# Patient Record
Sex: Male | Born: 1995 | Race: Black or African American | Hispanic: No | Marital: Single
Health system: Southern US, Community
[De-identification: ages and names within clinical notes are randomized; demographics above are authoritative.]

## PROBLEM LIST (undated history)

## (undated) HISTORY — PX: COSMETIC SURGERY: SHX468

## (undated) HISTORY — PX: CARDIAC SURGERY: SHX584

## (undated) HISTORY — PX: FRACTURE SURGERY: SHX138

---

## 2016-11-30 ENCOUNTER — Encounter (HOSPITAL_COMMUNITY): Payer: Self-pay | Admitting: *Deleted

## 2016-11-30 ENCOUNTER — Ambulatory Visit (HOSPITAL_COMMUNITY)
Admission: EM | Admit: 2016-11-30 | Discharge: 2016-11-30 | Disposition: A | Discharge status: Home / Self Care | Payer: Self-pay | Attending: Internal Medicine | Admitting: Internal Medicine

## 2016-11-30 DIAGNOSIS — A084 Viral intestinal infection, unspecified: Secondary | ICD-10-CM

## 2016-11-30 DIAGNOSIS — R197 Diarrhea, unspecified: Secondary | ICD-10-CM

## 2016-11-30 DIAGNOSIS — R112 Nausea with vomiting, unspecified: Secondary | ICD-10-CM

## 2016-11-30 MED ORDER — ONDANSETRON HCL 4 MG PO TABS: 4.0000 mg | ORAL_TABLET | Freq: Four times a day (QID) | ORAL | 0 refills | Status: DC

## 2016-11-30 MED ORDER — ONDANSETRON 4 MG PO TBDP
4.0000 mg | ORAL_TABLET | Freq: Once | ORAL | Status: AC
  Administered 2016-11-30: 4 mg via ORAL

## 2016-11-30 MED ORDER — ONDANSETRON 4 MG PO TBDP
ORAL_TABLET | ORAL | Status: AC
  Filled 2016-11-30: qty 1

## 2016-11-30 NOTE — Discharge Instructions (Signed)
Take Zofran for nausea and vomiting. No fatty foods, greasy foods, or fast foods or dairy for the next couple days. Stay well-hydrated drink water and Pedialyte.

## 2016-11-30 NOTE — ED Provider Notes (Signed)
CSN: [id number]     Arrival date & time 11/30/16  1550 History   First MD Initiated Contact with Patient 11/30/16 1701     Chief Complaint  Patient presents with  . Diarrhea   (Consider location/radiation/quality/duration/timing/severity/associated sxs/prior Treatment) 21 year old male states that early this morning he developed some vomiting and diarrhea, upset stomach and occasional dizziness. He has vomited approximately 4 times today and diarrhea all day. No blood is seen. Denies known fever. Has generalized abdominal discomfort.      History reviewed. No pertinent past medical history. History reviewed. No pertinent surgical history. No family history on file. Social History  Substance Use Topics  . Smoking status: Current Every Day Smoker  . Smokeless tobacco: Never Used  . Alcohol use Yes    Review of Systems  Constitutional: Positive for activity change. Negative for fever.  HENT: Negative.   Respiratory: Negative.   Cardiovascular: Negative.   Gastrointestinal: Positive for abdominal pain, diarrhea, nausea and vomiting. Negative for blood in stool.  Genitourinary: Negative.   Musculoskeletal: Negative.   Neurological: Positive for dizziness.  All other systems reviewed and are negative.   Allergies  Patient has no known allergies.  Home Medications   Prior to Admission medications   Medication Sig Start Date End Date Taking? Authorizing Provider  ondansetron (ZOFRAN) 4 MG tablet Take 1 tablet (4 mg total) by mouth every 6 (six) hours. 11/30/16   Hayden RasmussenMabe, Bernal Luhman, NP   Meds Ordered and Administered this Visit   Medications  ondansetron (ZOFRAN-ODT) disintegrating tablet 4 mg (not administered)    BP (!) 160/72 (BP Location: Right Arm)   Pulse 80   Temp 98.6 F (37 C) (Oral)   Resp 18  No data found.   Physical Exam  Constitutional: He is oriented to person, place, and time. He appears well-developed and well-nourished. No distress.  Eyes: EOM are normal.   Neck: Normal range of motion. Neck supple.  Cardiovascular: Normal rate, regular rhythm and normal heart sounds.   Pulmonary/Chest: Effort normal and breath sounds normal. No respiratory distress.  Abdominal: Bowel sounds are normal. He exhibits no distension and no mass.  Mild tenderness to the right hemiabdomen. No rebound or gaurding.  Neurological: He is alert and oriented to person, place, and time.  Skin: Skin is warm and dry.  Nursing note and vitals reviewed.   Urgent Care Course     Procedures (including critical care time)  Labs Review Labs Reviewed - No data to display  Imaging Review No results found.   Visual Acuity Review  Right Eye Distance:   Left Eye Distance:   Bilateral Distance:    Right Eye Near:   Left Eye Near:    Bilateral Near:         MDM   1. Viral gastroenteritis   2. Nausea vomiting and diarrhea    Take Zofran for nausea and vomiting. No fatty foods, greasy foods, or fast foods or dairy for the next couple days. Stay well-hydrated drink water and Pedialyte. Meds ordered this encounter  Medications  . ondansetron (ZOFRAN) 4 MG tablet    Sig: Take 1 tablet (4 mg total) by mouth every 6 (six) hours.    Dispense:  12 tablet    Refill:  0    Order Specific Question:   Supervising Provider    Answer:   Eustace MooreMURRAY, LAURA W [[id number]]  . ondansetron (ZOFRAN-ODT) disintegrating tablet 4 mg       Hayden RasmussenMabe, Danisa Kopec, NP 11/30/16 1726

## 2016-11-30 NOTE — ED Triage Notes (Signed)
Pt  Reports   Nausea  Vomiting   Diarrhea        And   Stomach      Pain    This  Am

## 2018-08-16 ENCOUNTER — Ambulatory Visit (INDEPENDENT_AMBULATORY_CARE_PROVIDER_SITE_OTHER): Payer: 59

## 2018-08-16 ENCOUNTER — Encounter: Payer: Self-pay | Admitting: Emergency Medicine

## 2018-08-16 ENCOUNTER — Ambulatory Visit
Admission: EM | Admit: 2018-08-16 | Discharge: 2018-08-16 | Disposition: A | Discharge status: Home / Self Care | Payer: 59 | Attending: Family Medicine | Admitting: Family Medicine

## 2018-08-16 DIAGNOSIS — S62619A Displaced fracture of proximal phalanx of unspecified finger, initial encounter for closed fracture: Secondary | ICD-10-CM | POA: Diagnosis not present

## 2018-08-16 DIAGNOSIS — W228XXA Striking against or struck by other objects, initial encounter: Secondary | ICD-10-CM | POA: Diagnosis not present

## 2018-08-16 MED ORDER — NAPROXEN 500 MG PO TABS: 500.0000 mg | ORAL_TABLET | Freq: Two times a day (BID) | ORAL | 0 refills | Status: DC

## 2018-08-16 NOTE — ED Triage Notes (Signed)
Pt presents to [facility] for assessment of left hand pain x 4 days after punching multiple surfaces and then states he threw a piece of clothing a few days ago and its been hurting with movement and swollen since.

## 2018-08-16 NOTE — ED Notes (Signed)
Patient able to ambulate independently  

## 2018-08-16 NOTE — Discharge Instructions (Signed)
X-ray showed small fracture of middle finger Splint placed Continue conservative management of rest, ice, and elevation Take naproxen as needed for pain relief (may cause abdominal discomfort, ulcers, and GI bleeds avoid taking with other NSAIDs) Follow up with hand specialist for further evaluation and management Return or go to the ER if you have any new or worsening symptoms (fever, chills, chest pain, worsening hand pain, increased redness or swelling, abdominal pain, changes in bowel or bladder habits, etc...)

## 2018-08-16 NOTE — ED Provider Notes (Signed)
[facility]   [id number] 08/16/18 Arrival Time: 1319  CC: Left middle finger pain  SUBJECTIVE: History from: patient. Bryce Pace is a RHD 23 y.o. male complains of left middle finger pain x 4 days.  Symptoms began after punching a wooden object.  Localizes the pain to the left middle finger.  Describes the pain as constant and throbbing in character.  Has tried OTC medications without relief.  Symptoms are made worse with making a fist.  Denies similar symptoms in the past. Complains of associated swelling.  Denies fever, chills, erythema, ecchymosis, weakness, numbness and tingling.      ROS: As per HPI.  History reviewed. No pertinent past medical history. History reviewed. No pertinent surgical history. No Known Allergies No current facility-administered medications on file prior to encounter.    No current outpatient medications on file prior to encounter.   Social History   Socioeconomic History  . Marital status: Single    Spouse name: Not on file  . Number of children: Not on file  . Years of education: Not on file  . Highest education level: Not on file  Occupational History  . Not on file  Social Needs  . Financial resource strain: Not on file  . Food insecurity:    Worry: Not on file    Inability: Not on file  . Transportation needs:    Medical: Not on file    Non-medical: Not on file  Tobacco Use  . Smoking status: Current Every Day Smoker  . Smokeless tobacco: Never Used  Substance and Sexual Activity  . Alcohol use: Yes  . Drug use: Not on file  . Sexual activity: Not on file  Lifestyle  . Physical activity:    Days per week: Not on file    Minutes per session: Not on file  . Stress: Not on file  Relationships  . Social connections:    Talks on phone: Not on file    Gets together: Not on file    Attends religious service: Not on file    Active member of club or organization: Not on file    Attends meetings of clubs or organizations:  Not on file    Relationship status: Not on file  . Intimate partner violence:    Fear of current or ex partner: Not on file    Emotionally abused: Not on file    Physically abused: Not on file    Forced sexual activity: Not on file  Other Topics Concern  . Not on file  Social History Narrative  . Not on file   History reviewed. No pertinent family history.  OBJECTIVE:  Vitals:   08/16/18 1328  BP: (!) 118/53  Pulse: (!) 52  Resp: 16  Temp: 98.2 F (36.8 C)  TempSrc: Oral  SpO2: 98%    General appearance: Alert; in no acute distress.  Head: NCAT Lungs: Normal respiratory effort CV: Radial pulse 2+; cap refill < 2 seconds Musculoskeletal: Left hand Inspection: Skin warm, dry, clear and intact.  Mild swelling over third proximal phalanx Palpation: TTP over 3rd proximal phalanx, and PIP joint ROM: LROM Strength: 3/5 grip strength Skin: warm and dry Neurologic: Ambulates without difficulty; Sensation intact about the upper extremities Psychological: alert and cooperative; normal mood and affect  DIAGNOSTIC STUDIES:  Dg Finger Middle Left  Result Date: 08/16/2018 CLINICAL DATA:  24 y/o M; hand injury after punching. Pain at the third MCP and PIP joints. EXAM: LEFT MIDDLE FINGER 2+V COMPARISON:  None. FINDINGS: Tiny acute avulsion fracture from the volar base of the third middle phalanx. No additional fracture or dislocation identified. IMPRESSION: Tiny acute avulsion fracture from the volar base of the third middle phalanx. No additional fracture or dislocation identified. Electronically Signed   By: Mitzi Hansen M.D.   On: 08/16/2018 13:53     ASSESSMENT & PLAN:  1. Closed avulsion fracture of proximal phalanx of finger, initial encounter    Meds ordered this encounter  Medications  . naproxen (NAPROSYN) 500 MG tablet    Sig: Take 1 tablet (500 mg total) by mouth 2 (two) times daily.    Dispense:  30 tablet    Refill:  0    Order Specific Question:    Supervising Provider    Answer:   Eustace Moore [[id number]]   X-ray showed small fracture of middle finger Splint placed Continue conservative management of rest, ice, and elevation Take naproxen as needed for pain relief (may cause abdominal discomfort, ulcers, and GI bleeds avoid taking with other NSAIDs) Follow up with hand specialist for further evaluation and management Return or go to the ER if you have any new or worsening symptoms (fever, chills, chest pain, worsening hand pain, increased redness or swelling, abdominal pain, changes in bowel or bladder habits, etc...)   Reviewed expectations re: course of current medical issues. Questions answered. Outlined signs and symptoms indicating need for more acute intervention. Patient verbalized understanding. After Visit Summary given.    [name], PA-C 08/16/18 1504

## 2019-02-14 ENCOUNTER — Other Ambulatory Visit: Payer: Self-pay

## 2019-02-14 ENCOUNTER — Emergency Department (HOSPITAL_COMMUNITY)
Admission: EM | Admit: 2019-02-14 | Discharge: 2019-02-15 | Disposition: A | Discharge status: Home / Self Care | Payer: 59 | Attending: Emergency Medicine | Admitting: Emergency Medicine

## 2019-02-14 DIAGNOSIS — Z79899 Other long term (current) drug therapy: Secondary | ICD-10-CM | POA: Diagnosis not present

## 2019-02-14 DIAGNOSIS — F17200 Nicotine dependence, unspecified, uncomplicated: Secondary | ICD-10-CM | POA: Insufficient documentation

## 2019-02-14 DIAGNOSIS — Y906 Blood alcohol level of 120-199 mg/100 ml: Secondary | ICD-10-CM | POA: Insufficient documentation

## 2019-02-14 DIAGNOSIS — M791 Myalgia, unspecified site: Secondary | ICD-10-CM | POA: Insufficient documentation

## 2019-02-14 DIAGNOSIS — R4182 Altered mental status, unspecified: Secondary | ICD-10-CM | POA: Diagnosis present

## 2019-02-14 DIAGNOSIS — F19920 Other psychoactive substance use, unspecified with intoxication, uncomplicated: Secondary | ICD-10-CM | POA: Diagnosis not present

## 2019-02-14 MED ORDER — LORAZEPAM 2 MG/ML IJ SOLN
1.0000 mg | Freq: Once | INTRAMUSCULAR | Status: AC
  Administered 2019-02-15: 1 mg via INTRAVENOUS
  Filled 2019-02-14: qty 1

## 2019-02-14 MED ORDER — FAMOTIDINE IN NACL 20-0.9 MG/50ML-% IV SOLN
20.0000 mg | Freq: Once | INTRAVENOUS | Status: AC
  Administered 2019-02-15: 20 mg via INTRAVENOUS
  Filled 2019-02-14: qty 50

## 2019-02-14 MED ORDER — SODIUM CHLORIDE 0.9 % IV BOLUS
2000.0000 mL | Freq: Once | INTRAVENOUS | Status: AC
  Administered 2019-02-15: 2000 mL via INTRAVENOUS

## 2019-02-14 NOTE — ED Triage Notes (Addendum)
Pt BIB [facility] from a business parking lot. Friends of pt called EMS d/t pt "passing out." Friends report pt has consumed ETOH all day, smoked weed and ate THC Nerd Ropes with 600mg  of THC per serving with 8 servings in the package. Pt is A&Ox0, and not responsive to painful stimuli.

## 2019-02-15 ENCOUNTER — Other Ambulatory Visit: Payer: Self-pay

## 2019-02-15 LAB — COMPREHENSIVE METABOLIC PANEL
ALT: 19 U/L (ref 0–44)
AST: 25 U/L (ref 15–41)
Albumin: 4.6 g/dL (ref 3.5–5.0)
Alkaline Phosphatase: 77 U/L (ref 38–126)
Anion gap: 12 (ref 5–15)
BUN: 10 mg/dL (ref 6–20)
CO2: 22 mmol/L (ref 22–32)
Calcium: 9 mg/dL (ref 8.9–10.3)
Chloride: 108 mmol/L (ref 98–111)
Creatinine, Ser: 0.97 mg/dL (ref 0.61–1.24)
GFR calc Af Amer: >60 mL/min (ref >60)
GFR calc non Af Amer: >60 mL/min (ref >60)
Glucose, Bld: 126 mg/dL — ABNORMAL HIGH (ref 70–99)
Potassium: 3.5 mmol/L (ref 3.5–5.1)
Sodium: 142 mmol/L (ref 135–145)
Total Bilirubin: 0.3 mg/dL (ref 0.3–1.2)
Total Protein: 7.7 g/dL (ref 6.5–8.1)

## 2019-02-15 LAB — CBC WITH DIFFERENTIAL/PLATELET
Abs Immature Granulocytes: 0.06 10*3/uL (ref 0.00–0.07)
Basophils Absolute: 0 10*3/uL (ref 0.0–0.1)
Basophils Relative: 0 %
Eosinophils Absolute: 0 10*3/uL (ref 0.0–0.5)
Eosinophils Relative: 0 %
HCT: 44.5 % (ref 39.0–52.0)
Hemoglobin: 14.7 g/dL (ref 13.0–17.0)
Immature Granulocytes: 1 %
Lymphocytes Relative: 38 %
Lymphs Abs: 2.6 10*3/uL (ref 0.7–4.0)
MCH: 30.4 pg (ref 26.0–34.0)
MCHC: 33 g/dL (ref 30.0–36.0)
MCV: 92.1 fL (ref 80.0–100.0)
Monocytes Absolute: 0.3 10*3/uL (ref 0.1–1.0)
Monocytes Relative: 5 %
Neutro Abs: 3.9 10*3/uL (ref 1.7–7.7)
Neutrophils Relative %: 56 %
Platelets: 231 10*3/uL (ref 150–400)
RBC: 4.83 MIL/uL (ref 4.22–5.81)
RDW: 12.5 % (ref 11.5–15.5)
WBC: 6.9 10*3/uL (ref 4.0–10.5)
nRBC: 0 % (ref 0.0–0.2)

## 2019-02-15 LAB — RAPID URINE DRUG SCREEN, HOSP PERFORMED
Amphetamines: NOT DETECTED
Barbiturates: NOT DETECTED
Benzodiazepines: NOT DETECTED
Cocaine: NOT DETECTED
Opiates: NOT DETECTED
Tetrahydrocannabinol: POSITIVE — AB

## 2019-02-15 LAB — CK: Total CK: 332 U/L (ref 49–397)

## 2019-02-15 LAB — ETHANOL: Alcohol, Ethyl (B): 140 mg/dL — ABNORMAL HIGH (ref <10)

## 2019-02-15 NOTE — ED Notes (Signed)
Spoke to Sister Hessie Knows [phone] on the phone. Sister wants to come back to be a Counselling psychologist. Due to pt being in the hallway, pt is not able to have visitors at this time. This was explained to sister and mother, they verbalized understanding. Family was reassured that the writer of this note will call back with updates and they are free to call with questions at anytime.

## 2019-02-15 NOTE — ED Notes (Signed)
Updated provided to mother.

## 2019-02-15 NOTE — ED Notes (Signed)
Pt ambulated to bathroom with standby assistance from RN.  

## 2019-02-15 NOTE — ED Notes (Signed)
Pt ambulated with a steady gait.

## 2019-02-15 NOTE — ED Provider Notes (Signed)
[facility]-EMERGENCY DEPT Provider Note   CSN: [id number] Arrival date & time: 02/14/19  2321     History   Chief Complaint Chief Complaint  Patient presents with  . Alcohol Intoxication  . Drug Overdose    LEVEL 5 CAVEAT 2/2 AMS  HPI Bryce Pace is a 23 y.o. male.    23 year old male presents to the emergency department for acute intoxication.  Per friends, patient has been consuming alcohol all day and smoked marijuana.  He also ate THC nerd ropes with 600 mg of THC per serving and 8 servings in the package.  Patient is complaining of diffuse body pain.  He is not currently complaining of any nausea, but has had some small-volume emesis in emesis bag.  Denies any shortness of breath.  History limited secondary to acute intoxication.  The history is provided by the patient and the EMS personnel. No language interpreter was used.  Alcohol Intoxication  Drug Overdose    No past medical history on file.  There are no active problems to display for this patient.   No past surgical history on file.      Home Medications    Prior to Admission medications   Medication Sig Start Date End Date Taking? Authorizing Provider  naproxen (NAPROSYN) 500 MG tablet Take 1 tablet (500 mg total) by mouth 2 (two) times daily. 08/16/18   Rennis HardingWurst, Brittany, PA-C    Family History No family history on file.  Social History Social History   Tobacco Use  . Smoking status: Current Every Day Smoker  . Smokeless tobacco: Never Used  Substance Use Topics  . Alcohol use: Yes  . Drug use: Not on file     Allergies   Patient has no known allergies.   Review of Systems Review of Systems  Unable to perform ROS: Mental status change  Patient intoxicated   Physical Exam Updated Vital Signs BP 100/62   Pulse (!) 55   Temp 97.6 F (36.4 C) (Oral)   Resp 16   SpO2 100%   Physical Exam Vitals signs and nursing note reviewed.  Constitutional:    General: He is not in acute distress.    Appearance: He is well-developed. He is not diaphoretic.     Comments: Patient alert and whimpering.  HENT:     Head: Normocephalic and atraumatic.     Mouth/Throat:     Comments: +Gag reflex Eyes:     General: No scleral icterus.    Conjunctiva/sclera: Conjunctivae normal.  Neck:     Musculoskeletal: Normal range of motion.  Cardiovascular:     Rate and Rhythm: Normal rate and regular rhythm.     Pulses: Normal pulses.  Pulmonary:     Effort: Pulmonary effort is normal. No respiratory distress.     Breath sounds: No stridor. No wheezing.     Comments: Respirations even and unlabored Musculoskeletal: Normal range of motion.  Skin:    General: Skin is warm and dry.     Coloration: Skin is not pale.     Findings: No erythema or rash.  Neurological:     Mental Status: He is alert.     Comments: Alert to sternal rubbing and loud voice. Will answer yes and no questions by shaking or nodding head. Moving all extremities spontaneously.  Psychiatric:        Behavior: Behavior normal.      ED Treatments / Results  Labs (all labs ordered are listed, but only abnormal  results are displayed) Labs Reviewed  RAPID URINE DRUG SCREEN, HOSP PERFORMED - Abnormal; Notable for the following components:      Result Value   Tetrahydrocannabinol POSITIVE (*)    All other components within normal limits  COMPREHENSIVE METABOLIC PANEL - Abnormal; Notable for the following components:   Glucose, Bld 126 (*)    All other components within normal limits  ETHANOL - Abnormal; Notable for the following components:   Alcohol, Ethyl (B) 140 (*)    All other components within normal limits  CK  CBC WITH DIFFERENTIAL/PLATELET    EKG None  Radiology No results found.  Procedures Procedures (including critical care time)  Medications Ordered in ED Medications  sodium chloride 0.9 % bolus 2,000 mL (0 mLs Intravenous Stopped 02/15/19 0128)  LORazepam  (ATIVAN) injection 1 mg (1 mg Intravenous Given 02/15/19 0015)  famotidine (PEPCID) IVPB 20 mg premix (0 mg Intravenous Stopped 02/15/19 0048)    4:28 AM Patient easily aroused.  Answers questions appropriately.  He has ambulated to the bathroom unassisted.  States that he would like to be discharged.  His sister will drive him home.   Initial Impression / Assessment and Plan / ED Course  I have reviewed the triage vital signs and the nursing notes.  Pertinent labs & imaging results that were available during my care of the patient were reviewed by me and considered in my medical decision making (see chart for details).        23 year old male presents to the emergency department for acute intoxication.  Binge drinking prior to arrival with excessive use of marijuana.  He has been observed in the ED without clinical decompensation.  Ambulatory without assistance at this time.  States that he is feeling better.  Labs reassuring.  Will discharge with sober ride.  Return precautions provided at discharge.   Final Clinical Impressions(s) / ED Diagnoses   Final diagnoses:  Intoxication by drug, uncomplicated [facility])    ED Discharge Orders    None       Antonietta Breach, PA-C 02/15/19 0428    Fatima Blank, MD 02/15/19 [phone]

## 2019-02-15 NOTE — ED Notes (Signed)
Pt is now able to talk to staff. Pt is confused as to why he is here. Pt was updated on situation and why pt was brought to the ED. Pt endorses drinking ETOH, smoking weed and consuming unknown "weed candy." Pt denies SI or HI.

## 2019-02-15 NOTE — ED Notes (Signed)
Spoke to pts mother on the phone for update.

## 2019-06-14 ENCOUNTER — Emergency Department (HOSPITAL_COMMUNITY)
Admission: EM | Admit: 2019-06-14 | Discharge: 2019-06-14 | Disposition: A | Discharge status: Other Acute Inpatient Hospital | Payer: Managed Care, Other (non HMO) | Attending: Emergency Medicine | Admitting: Emergency Medicine

## 2019-06-14 ENCOUNTER — Encounter (HOSPITAL_COMMUNITY): Payer: Self-pay

## 2019-06-14 ENCOUNTER — Other Ambulatory Visit: Payer: Self-pay

## 2019-06-14 ENCOUNTER — Inpatient Hospital Stay (HOSPITAL_COMMUNITY)
Admission: AD | Admit: 2019-06-14 | Discharge: 2019-06-16 | DRG: 881 | Disposition: A | Discharge status: Home / Self Care | Payer: Managed Care, Other (non HMO) | Source: Intra-hospital | Attending: Psychiatry | Admitting: Psychiatry

## 2019-06-14 ENCOUNTER — Encounter (HOSPITAL_COMMUNITY): Payer: Self-pay | Admitting: Psychiatry

## 2019-06-14 DIAGNOSIS — R45851 Suicidal ideations: Secondary | ICD-10-CM | POA: Diagnosis not present

## 2019-06-14 DIAGNOSIS — F17200 Nicotine dependence, unspecified, uncomplicated: Secondary | ICD-10-CM | POA: Insufficient documentation

## 2019-06-14 DIAGNOSIS — Z23 Encounter for immunization: Secondary | ICD-10-CM

## 2019-06-14 DIAGNOSIS — F1994 Other psychoactive substance use, unspecified with psychoactive substance-induced mood disorder: Secondary | ICD-10-CM | POA: Diagnosis present

## 2019-06-14 DIAGNOSIS — F332 Major depressive disorder, recurrent severe without psychotic features: Secondary | ICD-10-CM | POA: Insufficient documentation

## 2019-06-14 DIAGNOSIS — G4700 Insomnia, unspecified: Secondary | ICD-10-CM | POA: Diagnosis present

## 2019-06-14 DIAGNOSIS — T39312A Poisoning by propionic acid derivatives, intentional self-harm, initial encounter: Secondary | ICD-10-CM

## 2019-06-14 DIAGNOSIS — F329 Major depressive disorder, single episode, unspecified: Secondary | ICD-10-CM | POA: Diagnosis present

## 2019-06-14 DIAGNOSIS — R1084 Generalized abdominal pain: Secondary | ICD-10-CM | POA: Insufficient documentation

## 2019-06-14 DIAGNOSIS — Z20828 Contact with and (suspected) exposure to other viral communicable diseases: Secondary | ICD-10-CM | POA: Diagnosis present

## 2019-06-14 DIAGNOSIS — T50902A Poisoning by unspecified drugs, medicaments and biological substances, intentional self-harm, initial encounter: Secondary | ICD-10-CM | POA: Diagnosis not present

## 2019-06-14 DIAGNOSIS — T1491XA Suicide attempt, initial encounter: Secondary | ICD-10-CM

## 2019-06-14 LAB — ACETAMINOPHEN LEVEL
Acetaminophen (Tylenol), Serum: <10 ug/mL — ABNORMAL LOW (ref 10–30)
Acetaminophen (Tylenol), Serum: <10 ug/mL — ABNORMAL LOW (ref 10–30)

## 2019-06-14 LAB — COMPREHENSIVE METABOLIC PANEL
ALT: 21 U/L (ref 0–44)
AST: 22 U/L (ref 15–41)
Albumin: 4.3 g/dL (ref 3.5–5.0)
Alkaline Phosphatase: 72 U/L (ref 38–126)
Anion gap: 10 (ref 5–15)
BUN: 9 mg/dL (ref 6–20)
CO2: 25 mmol/L (ref 22–32)
Calcium: 9.2 mg/dL (ref 8.9–10.3)
Chloride: 105 mmol/L (ref 98–111)
Creatinine, Ser: 0.93 mg/dL (ref 0.61–1.24)
GFR calc Af Amer: >60 mL/min (ref >60)
GFR calc non Af Amer: >60 mL/min (ref >60)
Glucose, Bld: 90 mg/dL (ref 70–99)
Potassium: 3.8 mmol/L (ref 3.5–5.1)
Sodium: 140 mmol/L (ref 135–145)
Total Bilirubin: 0.4 mg/dL (ref 0.3–1.2)
Total Protein: 6.9 g/dL (ref 6.5–8.1)

## 2019-06-14 LAB — CBC WITH DIFFERENTIAL/PLATELET
Abs Immature Granulocytes: 0.01 10*3/uL (ref 0.00–0.07)
Basophils Absolute: 0 10*3/uL (ref 0.0–0.1)
Basophils Relative: 1 %
Eosinophils Absolute: 0 10*3/uL (ref 0.0–0.5)
Eosinophils Relative: 0 %
HCT: 43.9 % (ref 39.0–52.0)
Hemoglobin: 14.9 g/dL (ref 13.0–17.0)
Immature Granulocytes: 0 %
Lymphocytes Relative: 38 %
Lymphs Abs: 2.1 10*3/uL (ref 0.7–4.0)
MCH: 30.4 pg (ref 26.0–34.0)
MCHC: 33.9 g/dL (ref 30.0–36.0)
MCV: 89.6 fL (ref 80.0–100.0)
Monocytes Absolute: 0.6 10*3/uL (ref 0.1–1.0)
Monocytes Relative: 11 %
Neutro Abs: 2.8 10*3/uL (ref 1.7–7.7)
Neutrophils Relative %: 50 %
Platelets: 246 10*3/uL (ref 150–400)
RBC: 4.9 MIL/uL (ref 4.22–5.81)
RDW: 12.1 % (ref 11.5–15.5)
WBC: 5.6 10*3/uL (ref 4.0–10.5)
nRBC: 0 % (ref 0.0–0.2)

## 2019-06-14 LAB — RAPID URINE DRUG SCREEN, HOSP PERFORMED
Amphetamines: NOT DETECTED
Barbiturates: NOT DETECTED
Benzodiazepines: NOT DETECTED
Cocaine: NOT DETECTED
Opiates: NOT DETECTED
Tetrahydrocannabinol: POSITIVE — AB

## 2019-06-14 LAB — SALICYLATE LEVEL: Salicylate Lvl: <7 mg/dL (ref 2.8–30.0)

## 2019-06-14 LAB — ETHANOL: Alcohol, Ethyl (B): 57 mg/dL — ABNORMAL HIGH (ref <10)

## 2019-06-14 LAB — LIPASE, BLOOD: Lipase: 21 U/L (ref 11–51)

## 2019-06-14 LAB — SARS CORONAVIRUS 2 (TAT 6-24 HRS): SARS Coronavirus 2: NEGATIVE

## 2019-06-14 MED ORDER — ONDANSETRON HCL 4 MG/2ML IJ SOLN
4.0000 mg | Freq: Once | INTRAMUSCULAR | Status: AC
  Administered 2019-06-14: 08:00:00 4 mg via INTRAVENOUS
  Filled 2019-06-14: qty 2

## 2019-06-14 MED ORDER — PNEUMOCOCCAL VAC POLYVALENT 25 MCG/0.5ML IJ INJ
0.5000 mL | INJECTION | INTRAMUSCULAR | Status: AC
  Administered 2019-06-15: 11:00:00 0.5 mL via INTRAMUSCULAR

## 2019-06-14 MED ORDER — INFLUENZA VAC SPLIT QUAD 0.5 ML IM SUSY
0.5000 mL | PREFILLED_SYRINGE | INTRAMUSCULAR | Status: AC
  Administered 2019-06-15: 0.5 mL via INTRAMUSCULAR
  Filled 2019-06-14: qty 0.5

## 2019-06-14 MED ORDER — NICOTINE 21 MG/24HR TD PT24
21.0000 mg | MEDICATED_PATCH | Freq: Every day | TRANSDERMAL | Status: DC
  Administered 2019-06-15 – 2019-06-16 (×2): 21 mg via TRANSDERMAL
  Filled 2019-06-14 (×4): qty 1

## 2019-06-14 MED ORDER — SODIUM CHLORIDE 0.9 % IV BOLUS
1000.0000 mL | Freq: Once | INTRAVENOUS | Status: AC
  Administered 2019-06-14: 1000 mL via INTRAVENOUS

## 2019-06-14 MED ORDER — ALUM & MAG HYDROXIDE-SIMETH 200-200-20 MG/5ML PO SUSP: 30.0000 mL | ORAL | Status: DC | PRN

## 2019-06-14 MED ORDER — HYDROXYZINE HCL 25 MG PO TABS
25.0000 mg | ORAL_TABLET | Freq: Three times a day (TID) | ORAL | Status: DC | PRN
  Administered 2019-06-15: 21:00:00 25 mg via ORAL
  Filled 2019-06-14: qty 1

## 2019-06-14 MED ORDER — ACETAMINOPHEN 325 MG PO TABS: 650.0000 mg | ORAL_TABLET | Freq: Four times a day (QID) | ORAL | Status: DC | PRN

## 2019-06-14 MED ORDER — MAGNESIUM HYDROXIDE 400 MG/5ML PO SUSP: 30.0000 mL | Freq: Every day | ORAL | Status: DC | PRN

## 2019-06-14 NOTE — ED Notes (Signed)
Spoke to poison control regarding patient case. Reviewed labs and ekg info. Poison control stated they were closing case on patient and only recommend repeating a bmp if patient exhibits severe GI symptoms going forward. Notified MD

## 2019-06-14 NOTE — ED Triage Notes (Signed)
Pt states that he took a handful of 800 mg ibuprofen, he doesn't know how any, he also has been drinking a lot Pt now complains of abdominal pain

## 2019-06-14 NOTE — [facility] Assessment (Signed)
[facility] Assessment Progress Note  Case was staffed with Tate NP who recommended a inpatient admission.     

## 2019-06-14 NOTE — ED Provider Notes (Signed)
[facility] DEPT Provider Note   CSN: [id number] Arrival date & time: 06/14/19  0630     History Chief Complaint  Patient presents with  . Drug Overdose  . IVC    Bryce Pace is a 23 y.o. male with no significant PMHx who presents to the ED via EMS for suicide attempt and drug overdose. Per patient he took 3 handfuls of 800 mg Ibuprofen approximately 1 hour ago. He reports the bottle was almost full and there was about 5 pills left. He states he did so because "life is hard" and "everyone hates me." Pt also endorses drinking heavily tonight prior to taking the ibuprofen. Pt has hx of marijuana use as well. He is currently complaining of severe diffuse abdominal pain and nausea; no vomiting which prompted him to call EMS. Pt states he attempted to harm himself when he was "younger" but does not elaborate further. History limited due to pt being in severe pain.   The history is provided by the patient and the EMS personnel.       History reviewed. No pertinent past medical history.  There are no problems to display for this patient.   History reviewed. No pertinent surgical history.     History reviewed. No pertinent family history.  Social History   Tobacco Use  . Smoking status: Current Every Day Smoker  . Smokeless tobacco: Never Used  Substance Use Topics  . Alcohol use: Yes  . Drug use: Never    Home Medications Prior to Admission medications   Medication Sig Start Date End Date Taking? Authorizing Provider  naproxen (NAPROSYN) 500 MG tablet Take 1 tablet (500 mg total) by mouth 2 (two) times daily. Patient not taking: Reported on 06/14/2019 08/16/18   Lestine Box, PA-C    Allergies    Patient has no known allergies.  Review of Systems   Review of Systems  Constitutional: Negative for fever.  Gastrointestinal: Positive for abdominal pain and nausea. Negative for vomiting.  Psychiatric/Behavioral: Positive for self-injury  and suicidal ideas.  All other systems reviewed and are negative.   Physical Exam Updated Vital Signs BP 129/77   Pulse (!) 56   Temp (!) 97.3 F (36.3 C) (Oral)   Resp 16   Ht 5\' 10"  (1.778 m)   Wt 79.4 kg   SpO2 100%   BMI 25.11 kg/m   Physical Exam Vitals and nursing note reviewed.  Constitutional:      Appearance: He is not ill-appearing.  HENT:     Head: Normocephalic and atraumatic.  Eyes:     Extraocular Movements: Extraocular movements intact.     Conjunctiva/sclera: Conjunctivae normal.     Pupils: Pupils are equal, round, and reactive to light.  Cardiovascular:     Rate and Rhythm: Normal rate and regular rhythm.     Pulses: Normal pulses.  Pulmonary:     Effort: Pulmonary effort is normal.     Breath sounds: Normal breath sounds. No wheezing, rhonchi or rales.     Comments: Equal and unlabored breathing Abdominal:     Palpations: Abdomen is soft.     Tenderness: There is abdominal tenderness. There is no guarding or rebound.     Comments: Soft, diffuse abdominal tenderness, no rebound or guarding, +BS throughout, neg murphy's, neg mcburney's, no CVA TTP  Musculoskeletal:     Cervical back: Neck supple.     Comments: Moving all extremities  Skin:    General: Skin is warm and dry.  Neurological:     Mental Status: He is alert and oriented to person, place, and time.     ED Results / Procedures / Treatments   Labs (all labs ordered are listed, but only abnormal results are displayed) Labs Reviewed  RAPID URINE DRUG SCREEN, HOSP PERFORMED - Abnormal; Notable for the following components:      Result Value   Tetrahydrocannabinol POSITIVE (*)    All other components within normal limits  ETHANOL - Abnormal; Notable for the following components:   Alcohol, Ethyl (B) 57 (*)    All other components within normal limits  ACETAMINOPHEN LEVEL - Abnormal; Notable for the following components:   Acetaminophen (Tylenol), Serum <10 (*)    All other components  within normal limits  ACETAMINOPHEN LEVEL - Abnormal; Notable for the following components:   Acetaminophen (Tylenol), Serum <10 (*)    All other components within normal limits  SARS CORONAVIRUS 2 (TAT 6-24 HRS)  COMPREHENSIVE METABOLIC PANEL  CBC WITH DIFFERENTIAL/PLATELET  SALICYLATE LEVEL  LIPASE, BLOOD    EKG None EKG was sinus rhythm, RSR prime in V1, V2, unremarkable, nonspecific ST wave changes, unremarkable, unremarkable EKG for 23 year old male.  Radiology No results found.  Procedures Procedures (including critical care time)  Medications Ordered in ED Medications  sodium chloride 0.9 % bolus 1,000 mL (0 mLs Intravenous Stopped 06/14/19 0757)  ondansetron (ZOFRAN) injection 4 mg (4 mg Intravenous Given 06/14/19 0739)    ED Course  I have reviewed the triage vital signs and the nursing notes.  Pertinent labs & imaging results that were available during my care of the patient were reviewed by me and considered in my medical decision making (see chart for details). Clinical Course as of Jun 13 1533  Fri Jun 14, 2019  0711 Discussed case with Caryn Bee from Poison Control who recommends 4 hour post ingestion tylenol level (even if tylenol level obtained now is normal to rule out co-ingestion). If all labwork within normal limits observation for 8 hours and then cleared.    [MV]    Clinical Course User Index [MV] Tanda Rockers, PA-C   23 year old male presents the ED for suicide attempt with drug overdose.  Orts 3 handfuls of 800 mg ibuprofen with about 5 left in the bottle.  Sure how many were in the bottle in the beginning.  Currently complaining of nominal pain and is clutching his stomach stating he is nauseated.  He does endorse drinking alcohol tonight prior to taking ibuprofen.  States that "life is hard."  Vitals are stable today.  Temperature 97.3.  Pulse 59.  Rations 18.  Blood pressure 123/70 and satting 99% on room air.  EKG with normal sinus rhythm and no QTC  prolongation.  Will give Zofran.  Will obtain screening labs including CBC, CMP, lipase, Tylenol, acetaminophen level, UDS.  Have discussed case with poison control who is aware of patient.  See ED course or recommendations.  Patient IVC at this time.  Belongings placed in the locker.  Will have TTS consult once medically clear.  Labwork reassuring at this time.  No signs of metabolic acidosis or renal failure.  His initial alcohol level 57.  He is currently receiving fluids.  UDS positive for THC.  Initial acetaminophen level unremarkable.  Salicylate level unremarkable as well.  Remainder of lab work unremarkable.  Patient states his nausea has improved with Zofran but still states that he is having some abdominal discomfort.  His vitals are stable currently.   Repeat  Tylenol level 4 hours after ingestion within normal limits as well.  Doubt coingestion at this time.  12:14 PM Pt is medically cleared. Awaiting TTS eval.   He has has evaluated patient and recommends inpatient admission.  He is pending his Covid test at this time.   Patient evaluated in the ED for greater than 8 hours.  His vitals have remained stable.  All lab work unremarkable.  He is cleared at this time from an observation standpoint status post ingestion.  Awaiting transfer to behavioral health Hospital.   This note was prepared using Dragon voice recognition software and may include unintentional dictation errors due to the inherent limitations of voice recognition software.   MDM Rules/Calculators/A&P                       Final Clinical Impression(s) / ED Diagnoses Final diagnoses:  Intentional drug overdose, initial encounter [facility](HCC)  Intentional ibuprofen overdose, initial encounter [facility](HCC)  Generalized abdominal pain  Suicide attempt [facility](HCC)    Rx / DC Orders ED Discharge Orders    None       Tanda RockersVenter, Carren Blakley, PA-C 06/14/19 1534    Gerhard MunchLockwood, Robert, MD 06/16/19 [phone]

## 2019-06-14 NOTE — ED Provider Notes (Signed)
EKG was sinus rhythm, RSR prime in V1, V2, unremarkable, nonspecific ST wave changes, unremarkable, unremarkable EKG for 23 year old male.  Medical screening examination/treatment/procedure(s) were conducted as a shared visit with non-physician practitioner(s) and myself.  I personally evaluated the patient during the encounter.  My exam this young male was presenting after overdose. He is awake and alert, hemodynamically unremarkable, but given this event, required psychiatric evaluation.         Carmin Muskrat, MD 06/14/19 [phone]

## 2019-06-14 NOTE — ED Notes (Addendum)
Pt belongings upon arrival, 2 Silvertone necklaces, apple watch, 1 airpod bud, 2 Cell Phones, 1 charger, 1 apple watch charger, placed in bag for pt storage by RN, witnessed by EMT.

## 2019-06-14 NOTE — ED Notes (Signed)
Patient lying in bed, eyes closed. Easily arousable. Writer offered nutrition/hydration/toileting. Patient stated "I am not hungry".

## 2019-06-14 NOTE — Progress Notes (Signed)
Admission Note: Patient is a 23 year old male admitted to the unit for suicidal ideation, depression and hallucination.  Patient report taking 3 handfuls of 800 mg Ibuprofen in an attempt to kill himself.  Patient is alert and oriented x 4.  Presents with a flat affect and depressed mood.  States he is here to work on his anger and to prevent stuff from happening again. Admission plan of care reviewed and consent signed.  Skin assessment and personal belongings completed.  Patient oriented to the unit, staff and room.  Verbalizes understanding of unit rules and protocols.  Offered support and encouragement.  Routine safety checks initiated.  Patient is safe on the unit.

## 2019-06-14 NOTE — ED Notes (Signed)
Per tom hughes, counselor, patient is able to go to behavioral health pending negative covid results.

## 2019-06-14 NOTE — BH Assessment (Signed)
Assessment Note  Bryce Pace is an 23 y.o. male that presents this date with IVC after patient attempted suicide by overdose. Patient reported he took 3 "handfuls" of 800 mg Ibuprofen prior to arrival in a attempt to take his life. Patient reports the bottle was almost full and there was about 5 pills left. He states he did so because "life is hard" and "everyone hates me." Patient renders limited history at the time of assessment and appears to be in pain as evidenced by patient holding his stomach. Patient states he attempted self harm when he was younger although will not elaborate on that event. Patient states he was diagnosed with depression "years ago" although cannot recall time frame or provider. Patient denies any current medication interventions to assist with ongoing symptoms to include feeling worthless and isolating. Patient list current stressors to include: not having any primary support, relationship issues and "feeling lonely during the Holidays." Patient speaks in a low soft voice that is difficult to understand and makes limited eye contact. Patient denies any H/I or AVH. Patient reports ongoing SA issues to include using cannabis in various amounts one or two times a week for the last year with last use on 06/13/19 when he reported he used 1 gram. Patient also endorses sporadic alcohol use stating he consumes 3 to 4 beers a couple times a week with last use on 06/13/19 when he reported drinking "a few beers." Patient denies any current withdrawals. Patient's UDS is positive for cannabis this date and BAL was noted to be 57. History is limited due to patient being in severe pain. History is limited per chart review. Patient presents as being alert and oriented. His affect is flat and blunted and he appears to have some thought blocking although as noted above appears to be in pain.  His eye contact is poor and his speech was soft. His thought processes are logical and memory appears to be  intact, but he is guarded. Case was staffed with Arlana Pouchate NP who recommended a inpatient admission.      Diagnosis: F33.2 MDD recurrent without psychotic features, severe, cannabis use, alcohol abuse    Past Medical History: History reviewed. No pertinent past medical history.  History reviewed. No pertinent surgical history.  Family History: History reviewed. No pertinent family history.  Social History:  reports that he has been smoking. He has never used smokeless tobacco. He reports current alcohol use. He reports that he does not use drugs.  Additional Social History:  Alcohol / Drug Use Pain Medications: See MAR Prescriptions: See MAR Over the Counter: See MAR History of alcohol / drug use?: Yes Longest period of sobriety (when/how long): Unknown Negative Consequences of Use: (Denies) Withdrawal Symptoms: (denies) Substance #1 Name of Substance 1: Cannabis 1 - Age of First Use: 18 1 - Amount (size/oz): Varies 1 - Frequency: Varies 1 - Duration: Ongoing 1 - Last Use / Amount: 06/13/19 unknown amount  CIWA: CIWA-Ar BP: 108/75 Pulse Rate: (!) 54 COWS:    Allergies: No Known Allergies  Home Medications: (Not in a hospital admission)   OB/GYN Status:  No LMP for male patient.  General Assessment Data Location of Assessment: [facility] ED TTS Assessment: In system Is this a Tele or Face-to-Face Assessment?: Face-to-Face Is this an Initial Assessment or a Re-assessment for this encounter?: Initial Assessment Patient Accompanied by:: N/A Language Other than English: No Living Arrangements: Other (Comment) What gender do you identify as?: Male Marital status: Single Living Arrangements: Other  relatives Can pt return to current living arrangement?: Yes Admission Status: Involuntary Petitioner: ED Attending Is patient capable of signing voluntary admission?: Yes Referral Source: MD Insurance type: AETNA  Medical Screening Exam [facility] Walk-in ONLY) Medical Exam completed:  Yes  Crisis Care Plan Living Arrangements: Other relatives Legal Guardian: (NA) Name of Psychiatrist: None Name of Therapist: None  Education Status Is patient currently in school?: No Is the patient employed, unemployed or receiving disability?: Unemployed  Risk to self with the past 6 months Suicidal Ideation: Yes-Currently Present Has patient been a risk to self within the past 6 months prior to admission? : No Suicidal Intent: Yes-Currently Present Has patient had any suicidal intent within the past 6 months prior to admission? : No Is patient at risk for suicide?: Yes Suicidal Plan?: Yes-Currently Present Has patient had any suicidal plan within the past 6 months prior to admission? : No Specify Current Suicidal Plan: Overdose Access to Means: Yes Specify Access to Suicidal Means: Pt had OTC medications What has been your use of drugs/alcohol within the last 12 months?: Current use  Previous Attempts/Gestures: Yes How many times?: 1 Other Self Harm Risks: (NA) Triggers for Past Attempts: Unknown Intentional Self Injurious Behavior: None Family Suicide History: No Recent stressful life event(s): Other (Comment)(Relationship issues ) Persecutory voices/beliefs?: No Depression: Yes Depression Symptoms: Feeling worthless/self pity Substance abuse history and/or treatment for substance abuse?: No Suicide prevention information given to non-admitted patients: Not applicable  Risk to Others within the past 6 months Homicidal Ideation: No Does patient have any lifetime risk of violence toward others beyond the six months prior to admission? : No Thoughts of Harm to Others: No Current Homicidal Intent: No Current Homicidal Plan: No Access to Homicidal Means: No Identified Victim: NA History of harm to others?: No Assessment of Violence: None Noted Violent Behavior Description: NA Does patient have access to weapons?: No Criminal Charges Pending?: No Does patient have a  court date: No Is patient on probation?: No  Psychosis Hallucinations: None noted Delusions: None noted  Mental Status Report Appearance/Hygiene: In scrubs Eye Contact: Fair Motor Activity: Freedom of movement Speech: Soft, Slow Level of Consciousness: Drowsy Mood: Depressed Affect: Flat Anxiety Level: Minimal Thought Processes: Thought Blocking Judgement: Impaired Orientation: Person, Place, Time Obsessive Compulsive Thoughts/Behaviors: None  Cognitive Functioning Concentration: Decreased Memory: Recent Intact, Remote Intact Is patient IDD: No Insight: Fair Impulse Control: Poor Appetite: Good Have you had any weight changes? : No Change Sleep: No Change Total Hours of Sleep: 7 Vegetative Symptoms: None  ADLScreening [facility] Assessment Services) Patient's cognitive ability adequate to safely complete daily activities?: Yes Patient able to express need for assistance with ADLs?: Yes Independently performs ADLs?: Yes (appropriate for developmental age)  Prior Inpatient Therapy Prior Inpatient Therapy: No  Prior Outpatient Therapy Prior Outpatient Therapy: No Does patient have an ACCT team?: No Does patient have Intensive In-House Services?  : No Does patient have [facility] services? : No Does patient have P4CC services?: No  ADL Screening (condition at time of admission) Patient's cognitive ability adequate to safely complete daily activities?: Yes Is the patient deaf or have difficulty hearing?: No Does the patient have difficulty seeing, even when wearing glasses/contacts?: No Does the patient have difficulty concentrating, remembering, or making decisions?: No Patient able to express need for assistance with ADLs?: Yes Does the patient have difficulty dressing or bathing?: No Independently performs ADLs?: Yes (appropriate for developmental age) Does the patient have difficulty walking or climbing stairs?: No Weakness of  Legs: None Weakness of Arms/Hands:  None  Home Assistive Devices/Equipment Home Assistive Devices/Equipment: None  Therapy Consults (therapy consults require a physician order) PT Evaluation Needed: No OT Evalulation Needed: No SLP Evaluation Needed: No Abuse/Neglect Assessment (Assessment to be complete while patient is alone) Abuse/Neglect Assessment Can Be Completed: Yes Physical Abuse: Denies Verbal Abuse: Denies Sexual Abuse: Denies Exploitation of patient/patient's resources: Denies Self-Neglect: Denies Values / Beliefs Cultural Requests During Hospitalization: None Spiritual Requests During Hospitalization: None Consults Spiritual Care Consult Needed: No Transition of Care Team Consult Needed: No Advance Directives (For Healthcare) Does Patient Have a Medical Advance Directive?: No Would patient like information on creating a medical advance directive?: No - Patient declined          Disposition: Case was staffed with Hall Busing NP who recommended a inpatient admission.     Disposition Initial Assessment Completed for this Encounter: Yes Disposition of Patient: Admit Type of inpatient treatment program: Adult Patient refused recommended treatment: No  On Site Evaluation by:   Reviewed with Physician:    Mamie Nick 06/14/2019 12:23 PM

## 2019-06-14 NOTE — ED Triage Notes (Signed)
Pt states he was trying to kill himself because no one likes him and he doesn't have any family here

## 2019-06-14 NOTE — Tx Team (Signed)
Initial Treatment Plan 06/14/2019 6:42 PM [name] UKG:[id number]    PATIENT STRESSORS: Financial difficulties Health problems Legal issue Substance abuse   PATIENT STRENGTHS: Average or above average intelligence Capable of independent living Communication skills Motivation for treatment/growth   PATIENT IDENTIFIED PROBLEMS: "Work on my anger"  Suicidal ideation  Depression  Hallucination  Substance Abuse             DISCHARGE CRITERIA:  Ability to meet basic life and health needs Motivation to continue treatment in a less acute level of care  PRELIMINARY DISCHARGE PLAN: Attend aftercare/continuing care group Outpatient therapy Placement in alternative living arrangements  PATIENT/FAMILY INVOLVEMENT: This treatment plan has been presented to and reviewed with the patient, Bryce Pace, and/or family member.  The patient and family have been given the opportunity to ask questions and make suggestions.  Vela Prose, RN 06/14/2019, 6:42 PM

## 2019-06-14 NOTE — BH Assessment (Signed)
[facility] Assessment Progress Note  Per Letitia Libra, FNP, this pt requires psychiatric hospitalization.  Heather, RN has pre-assigned pt to [facility] Rm 306-2.  Before being transferred, however, he must have a negative Covid-19 result and must have Poison Control sign off on his medical clearance.  Pt presents under IVC initiated by EDP Addison Lank, MD, and IVC documents have been faxed to [facility]  Pt's nurse, Melody, has been notified, and agrees to call report to [phone].  Pt is to be transported via [facility] when the time comes.   Jalene Mullet, [facility] Coordinator [phone]

## 2019-06-15 DIAGNOSIS — F332 Major depressive disorder, recurrent severe without psychotic features: Secondary | ICD-10-CM

## 2019-06-15 DIAGNOSIS — F1994 Other psychoactive substance use, unspecified with psychoactive substance-induced mood disorder: Secondary | ICD-10-CM

## 2019-06-15 MED ORDER — FOLIC ACID 1 MG PO TABS
1.0000 mg | ORAL_TABLET | Freq: Every day | ORAL | Status: DC
  Administered 2019-06-15 – 2019-06-16 (×2): 1 mg via ORAL
  Filled 2019-06-15 (×4): qty 1

## 2019-06-15 MED ORDER — PANTOPRAZOLE SODIUM 40 MG PO TBEC
40.0000 mg | DELAYED_RELEASE_TABLET | Freq: Every day | ORAL | Status: DC
  Administered 2019-06-15 – 2019-06-16 (×2): 40 mg via ORAL
  Filled 2019-06-15 (×6): qty 1

## 2019-06-15 MED ORDER — CITALOPRAM HYDROBROMIDE 10 MG PO TABS
10.0000 mg | ORAL_TABLET | Freq: Every day | ORAL | Status: DC
  Administered 2019-06-15 – 2019-06-16 (×2): 10 mg via ORAL
  Filled 2019-06-15 (×4): qty 1

## 2019-06-15 MED ORDER — THIAMINE HCL 100 MG PO TABS
100.0000 mg | ORAL_TABLET | Freq: Every day | ORAL | Status: DC
  Administered 2019-06-15 – 2019-06-16 (×2): 100 mg via ORAL
  Filled 2019-06-15 (×4): qty 1

## 2019-06-15 MED ORDER — LORAZEPAM 1 MG PO TABS: 1.0000 mg | ORAL_TABLET | Freq: Four times a day (QID) | ORAL | Status: DC | PRN

## 2019-06-15 NOTE — Progress Notes (Signed)
[facility] Group Notes:  (Nursing/MHT/Case Management/Adjunct)  Date:  06/15/2019  Time:1345  Type of Therapy:  Nurse Education Discussed goals for hospitalization.   Participation Level:  Active  Participation Quality:  Appropriate  Affect:  Appropriate  Cognitive:  Appropriate  Insight:  Appropriate  Engagement in Group:  Engaged  Modes of Intervention:  Discussion and Education  Summary of Progress/Problems:  

## 2019-06-15 NOTE — [facility] Suicide Risk Assessment (Signed)
[facility] Admission Suicide Risk Assessment   Nursing information obtained from:  Patient Demographic factors:  Adolescent or young adult Current Mental Status:  Self-harm thoughts Loss Factors:  Financial problems / change in socioeconomic status, Legal issues Historical Factors:  Prior suicide attempts Risk Reduction Factors:  NA  Total Time spent with patient: 45 minutes Principal Problem: <principal problem not specified> Diagnosis:  Active Problems:   MDD (major depressive disorder)   Substance induced mood disorder (HCC)  Subjective Data: Patient is seen and examined.  Patient is a 23 year old male with a past psychiatric history significant for alcohol use disorder and marijuana use disorder who presented to the [facility] emergency department on 06/14/2019 after an intentional overdose of ibuprofen.  The patient stated while he was in the emergency room that he did it because "life is so hard" and "everybody hates me".  The patient admitted that he had tried to hurt himself when he was a child, but nothing recently.  Review of the electronic medical record revealed a visit earlier this year in which his blood alcohol was at 140, and on this admission his blood alcohol was at 57.  His drug screen was also positive for cannabis.  He stated he had used drugs and alcohol to treat his depression by self-medication.  We discussed that.  He denied any previous history of alcohol withdrawal syndrome.  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  He stated he was willing to take psychiatric medication, and we discussed options.  He was admitted to the hospital for evaluation and stabilization.  Continued Clinical Symptoms:  Alcohol Use Disorder Identification Test Final Score (AUDIT): 2 The "Alcohol Use Disorders Identification Test", Guidelines for Use  in Primary Care, Second Edition.  World Science writer [facility]). Score between 0-7:  no or low risk or alcohol related problems. Score between 8-15:  moderate risk of alcohol related problems. Score between 16-19:  high risk of alcohol related problems. Score 20 or above:  warrants further diagnostic evaluation for alcohol dependence and treatment.   CLINICAL FACTORS:   Depression:   Anhedonia Comorbid alcohol abuse/dependence Hopelessness Impulsivity Insomnia Alcohol/Substance Abuse/Dependencies   Musculoskeletal: Strength & Muscle Tone: within normal limits Gait & Station: normal Patient leans: N/A  Psychiatric Specialty Exam: Physical Exam  Nursing note and vitals reviewed. Constitutional: He is oriented to person, place, and time. He appears well-developed and well-nourished.  HENT:  Head: Normocephalic and atraumatic.  Respiratory: Effort normal.  Neurological: He is alert and oriented to person, place, and time.    Review of Systems  Blood pressure 116/74, pulse (!) 47, temperature 98.2 F (36.8 C), temperature source Oral, resp. rate 16, height 5\' 11"  (1.803 m), weight 61.2 kg, SpO2 98 %.Body mass index is 18.83 kg/m.  General Appearance: Casual  Eye Contact:  Fair  Speech:  Normal Rate  Volume:  Normal  Mood:  Anxious and Depressed  Affect:  Congruent  Thought Process:  Coherent and Descriptions of Associations: Intact  Orientation:  Full (Time, Place, and Person)  Thought Content:  Logical  Suicidal Thoughts:  No  Homicidal Thoughts:  No  Memory:  Immediate;   Fair Recent;   Fair Remote;   Fair  Judgement:  Intact  Insight:  Fair  Psychomotor Activity:  Normal  Concentration:  Concentration: Fair and Attention Span: Fair  Recall:  Smiley Houseman of Knowledge:  Good  Language:  Good  Akathisia:  Negative  Handed:  Right  AIMS (if indicated):     Assets:  Desire for Improvement Resilience  ADL's:  Intact  Cognition:  WNL  Sleep:  Number of Hours: 4       COGNITIVE FEATURES THAT CONTRIBUTE TO RISK:  None    SUICIDE RISK:   Mild:  Suicidal ideation of limited frequency, intensity, duration, and specificity.  There are no identifiable plans, no associated intent, mild dysphoria and related symptoms, good self-control (both objective and subjective assessment), few other risk factors, and identifiable protective factors, including available and accessible social support.  PLAN OF CARE: Patient is seen and examined.  Patient is a 23 year old male with the above-stated past psychiatric history who was admitted to the psychiatric hospital secondary to an intentional overdose of Advil.  He will be admitted to the hospital.  He will be integrated into the milieu.  He will be encouraged to attend groups.  He will be monitored for alcohol withdrawal symptoms.  He will be started on Celexa 10 mg p.o. daily for depression and anxiety.  We will contact his family for collateral information over what occurred.  He will have lorazepam 1 mg p.o. every 6 hours as needed a CIWA greater than 10.  He will also be started on folic acid as well as thiamine.  Given the nonsteroidal overdose he will be placed on Protonix 40 mg p.o. daily for gastric protection.  I certify that inpatient services furnished can reasonably be expected to improve the patient's condition.   Sharma Covert, MD 06/15/2019, 12:06 PM

## 2019-06-15 NOTE — Plan of Care (Signed)
  Problem: Coping: Goal: Coping ability will improve Outcome: Progressing   Problem: Medication: Goal: Compliance with prescribed medication regimen will improve Outcome: Progressing   

## 2019-06-15 NOTE — H&P (Signed)
Psychiatric Admission Assessment Adult  Patient Identification: Bryce Pace MRN:  [id number] Date of Evaluation:  06/15/2019 Chief Complaint:  MDD (major depressive disorder) [F32.9] Substance induced mood disorder (HCC) [F19.94] Principal Diagnosis: <principal problem not specified> Diagnosis:  Active Problems:   MDD (major depressive disorder)   Substance induced mood disorder (HCC)  History of Present Illness: Patient is seen and examined.  Patient is a 23 year old male with a past psychiatric history significant for alcohol use disorder and marijuana use disorder who presented to the [facility] emergency department on 06/14/2019 after an intentional overdose of ibuprofen.  The patient stated while he was in the emergency room that he did it because "life is so hard" and "everybody hates me".  The patient admitted that he had tried to hurt himself when he was a child, but nothing recently.  Review of the electronic medical record revealed a visit earlier this year in which his blood alcohol was at 140, and on this admission his blood alcohol was at 57.  His drug screen was also positive for cannabis.  He stated he had used drugs and alcohol to treat his depression by self-medication.  We discussed that.  He denied any previous history of alcohol withdrawal syndrome.  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  He stated he was willing to take psychiatric medication, and we discussed options.  He was admitted to the hospital for evaluation and stabilization.  Associated Signs/Symptoms: Depression Symptoms:  depressed mood, anhedonia, insomnia, psychomotor retardation, fatigue, feelings of worthlessness/guilt, difficulty concentrating, hopelessness, suicidal attempt, anxiety, disturbed sleep, weight loss, (Hypo) Manic Symptoms:   Impulsivity, Anxiety Symptoms:  Excessive Worry, Psychotic Symptoms:  Denied PTSD Symptoms: Had a traumatic exposure:  Multiple things as a child. Total Time spent with patient: 45 minutes  Past Psychiatric History: Patient admitted to 1 previous suicide attempt as a child.  He has had no formal outpatient treatment or inpatient treatment.  Is the patient at risk to self? Yes.    Has the patient been a risk to self in the past 6 months? No.  Has the patient been a risk to self within the distant past? Yes.    Is the patient a risk to others? No.  Has the patient been a risk to others in the past 6 months? No.  Has the patient been a risk to others within the distant past? No.   Prior Inpatient Therapy:   Prior Outpatient Therapy:    Alcohol Screening: 1. How often do you have a drink containing alcohol?: 2 to 4 times a month 2. How many drinks containing alcohol do you have on a typical day when you are drinking?: 1 or 2 3. How often do you have six or more drinks on one occasion?: Never AUDIT-C Score: 2 4. How often during the last year have you found that you were not able to stop drinking once you had started?: Never 5. How often during the last year have you failed to do what was normally expected from you becasue of drinking?: Never 6. How often during the last year have you needed a first drink in the morning to get yourself going after a heavy drinking session?: Never 7. How often during the last year have you had a feeling of guilt of remorse after drinking?: Never 8. How often  during the last year have you been unable to remember what happened the night before because you had been drinking?: Never 9. Have you or someone else been injured as a result of your drinking?: No 10. Has a relative or friend or a doctor or another health worker been concerned about your drinking or suggested you cut down?: No Alcohol Use Disorder Identification Test Final Score (AUDIT): 2 Substance  Abuse History in the last 12 months:  Yes.   Consequences of Substance Abuse: Probably contributed to his suicide attempt. Previous Psychotropic Medications: No  Psychological Evaluations: No  Past Medical History: History reviewed. No pertinent past medical history. History reviewed. No pertinent surgical history. Family History: History reviewed. No pertinent family history. Family Psychiatric  History: Mother apparently had unspecified psychiatric issues. Tobacco Screening:   Social History:  Social History   Substance and Sexual Activity  Alcohol Use Yes     Social History   Substance and Sexual Activity  Drug Use Never    Additional Social History:                           Allergies:  No Known Allergies Lab Results:  Results for orders placed or performed during the hospital encounter of 06/14/19 (from the past 48 hour(s))  Urine rapid drug screen (hosp performed)     Status: Abnormal   Collection Time: 06/14/19  6:40 AM  Result Value Ref Range   Opiates NONE DETECTED NONE DETECTED   Cocaine NONE DETECTED NONE DETECTED   Benzodiazepines NONE DETECTED NONE DETECTED   Amphetamines NONE DETECTED NONE DETECTED   Tetrahydrocannabinol POSITIVE (A) NONE DETECTED   Barbiturates NONE DETECTED NONE DETECTED    Comment: (NOTE) DRUG SCREEN FOR MEDICAL PURPOSES ONLY.  IF CONFIRMATION IS NEEDED FOR ANY PURPOSE, NOTIFY LAB WITHIN 5 DAYS. LOWEST DETECTABLE LIMITS FOR URINE DRUG SCREEN Drug Class                     Cutoff (ng/mL) Amphetamine and metabolites    1000 Barbiturate and metabolites    200 Benzodiazepine                 200 Tricyclics and metabolites     300 Opiates and metabolites        300 Cocaine and metabolites        300 THC                            50 Performed at [facility], [address] [place], [place] [address]   Comprehensive metabolic panel     Status: None   Collection Time: 06/14/19  6:40 AM  Result Value Ref  Range   Sodium 140 135 - 145 mmol/L   Potassium 3.8 3.5 - 5.1 mmol/L   Chloride 105 98 - 111 mmol/L   CO2 25 22 - 32 mmol/L   Glucose, Bld 90 70 - 99 mg/dL   BUN 9 6 - 20 mg/dL   Creatinine, Ser 1.91 0.61 - 1.24 mg/dL   Calcium 9.2 8.9 - 47.8 mg/dL   Total Protein 6.9 6.5 - 8.1 g/dL   Albumin 4.3 3.5 - 5.0 g/dL   AST 22 15 - 41 U/L   ALT 21 0 - 44 U/L   Alkaline Phosphatase 72 38 - 126 U/L   Total Bilirubin 0.4 0.3 - 1.2 mg/dL   GFR calc  non Af Amer >60 >60 mL/min   GFR calc Af Amer >60 >60 mL/min   Anion gap 10 5 - 15    Comment: Performed at [facility], [place] [address] [place], [place] [address]  Ethanol     Status: Abnormal   Collection Time: 06/14/19  6:40 AM  Result Value Ref Range   Alcohol, Ethyl (B) 57 (H) <10 mg/dL    Comment: (NOTE) Lowest detectable limit for serum alcohol is 10 mg/dL. For medical purposes only. Performed at [facility], [place] [address], [place], [place] [address]   CBC with Differential     Status: None   Collection Time: 06/14/19  6:40 AM  Result Value Ref Range   WBC 5.6 4.0 - 10.5 K/uL   RBC 4.90 4.22 - 5.81 MIL/uL   Hemoglobin 14.9 13.0 - 17.0 g/dL   HCT 43.9 39.0 - 52.0 %   MCV 89.6 80.0 - 100.0 fL   MCH 30.4 26.0 - 34.0 pg   MCHC 33.9 30.0 - 36.0 g/dL   RDW 12.1 11.5 - 15.5 %   Platelets 246 150 - 400 K/uL   nRBC 0.0 0.0 - 0.2 %   Neutrophils Relative % 50 %   Neutro Abs 2.8 1.7 - 7.7 K/uL   Lymphocytes Relative 38 %   Lymphs Abs 2.1 0.7 - 4.0 K/uL   Monocytes Relative 11 %   Monocytes Absolute 0.6 0.1 - 1.0 K/uL   Eosinophils Relative 0 %   Eosinophils Absolute 0.0 0.0 - 0.5 K/uL   Basophils Relative 1 %   Basophils Absolute 0.0 0.0 - 0.1 K/uL   Immature Granulocytes 0 %   Abs Immature Granulocytes 0.01 0.00 - 0.07 K/uL    Comment: Performed at [facility], [place] [address], [place], [place] [address]  Acetaminophen level     Status: Abnormal   Collection Time:  06/14/19  6:40 AM  Result Value Ref Range   Acetaminophen (Tylenol), Serum <10 (L) 10 - 30 ug/mL    Comment: (NOTE) Therapeutic concentrations vary significantly. A range of 10-30 ug/mL  may be an effective concentration for many patients. However, some  are best treated at concentrations outside of this range. Acetaminophen concentrations >150 ug/mL at 4 hours after ingestion  and >50 ug/mL at 12 hours after ingestion are often associated with  toxic reactions. Performed at [facility], [place] [address], [place], [place] [address]   Salicylate level     Status: None   Collection Time: 06/14/19  6:40 AM  Result Value Ref Range   Salicylate Lvl <5.6 2.8 - 30.0 mg/dL    Comment: Performed at [facility], [place] [address], [place],  [address]  Lipase, blood     Status: None   Collection Time: 06/14/19  6:40 AM  Result Value Ref Range   Lipase 21 11 - 51 U/L    Comment: Performed at [facility], [place] [address], [place], [place] [address]  SARS CORONAVIRUS 2 (TAT 6-24 HRS) Nasopharyngeal Nasopharyngeal Swab     Status: None   Collection Time: 06/14/19  7:42 AM   Specimen: Nasopharyngeal Swab  Result Value Ref Range   SARS Coronavirus 2 NEGATIVE NEGATIVE    Comment: (NOTE) SARS-CoV-2 target nucleic acids are NOT DETECTED. The SARS-CoV-2 RNA is generally detectable in upper and lower respiratory specimens during the acute phase of infection. Negative results do not preclude SARS-CoV-2 infection, do not rule out co-infections  with other pathogens, and should not be used as the sole basis for treatment or other patient management decisions. Negative results must be combined with clinical observations, patient history, and epidemiological information. The expected result is Negative. Fact Sheet for Patients: HairSlick.no Fact Sheet for Healthcare  Providers: quierodirigir.com This test is not yet approved or cleared by the [place] FDA and  has been authorized for detection and/or diagnosis of SARS-CoV-2 by FDA under an Emergency Use Authorization (EUA). This EUA will remain  in effect (meaning this test can be used) for the duration of the COVID-19 declaration under Section 56 4(b)(1) of the Act, 21 U.S.C. section 360bbb-3(b)(1), unless the authorization is terminated or revoked sooner. Performed at [facility], [address] [place], [place] [address]   Acetaminophen level     Status: Abnormal   Collection Time: 06/14/19 11:07 AM  Result Value Ref Range   Acetaminophen (Tylenol), Serum <10 (L) 10 - 30 ug/mL    Comment: (NOTE) Therapeutic concentrations vary significantly. A range of 10-30 ug/mL  may be an effective concentration for many patients. However, some  are best treated at concentrations outside of this range. Acetaminophen concentrations >150 ug/mL at 4 hours after ingestion  and >50 ug/mL at 12 hours after ingestion are often associated with  toxic reactions. Performed at [facility], [address], [place], [place] [address]     Blood Alcohol level:  Lab Results  Component Value Date   ETH 57 (H) 06/14/2019   ETH 140 (H) 02/15/2019    Metabolic Disorder Labs:  No results found for: HGBA1C, MPG No results found for: PROLACTIN No results found for: CHOL, TRIG, HDL, CHOLHDL, VLDL, LDLCALC  Current Medications: Current Facility-Administered Medications  Medication Dose Route Frequency Provider Last Rate Last Admin  . acetaminophen (TYLENOL) tablet 650 mg  650 mg Oral Q6H PRN Patrcia Dolly, FNP      . alum & mag hydroxide-simeth (MAALOX/MYLANTA) 200-200-20 MG/5ML suspension 30 mL  30 mL Oral Q4H PRN Patrcia Dolly, FNP      . citalopram (CELEXA) tablet 10 mg  10 mg Oral Daily Antonieta Pert, MD   10 mg at 06/15/19 1124  . folic acid (FOLVITE)  tablet 1 mg  1 mg Oral Daily Antonieta Pert, MD   1 mg at 06/15/19 1124  . hydrOXYzine (ATARAX/VISTARIL) tablet 25 mg  25 mg Oral TID PRN Patrcia Dolly, FNP      . LORazepam (ATIVAN) tablet 1 mg  1 mg Oral Q6H PRN Antonieta Pert, MD      . magnesium hydroxide (MILK OF MAGNESIA) suspension 30 mL  30 mL Oral Daily PRN Patrcia Dolly, FNP      . nicotine (NICODERM CQ - dosed in mg/24 hours) patch 21 mg  21 mg Transdermal Daily Nira Conn A, NP   21 mg at 06/15/19 0800  . pantoprazole (PROTONIX) EC tablet 40 mg  40 mg Oral Daily Antonieta Pert, MD      . thiamine tablet 100 mg  100 mg Oral Daily Antonieta Pert, MD   100 mg at 06/15/19 1124   PTA Medications: Medications Prior to Admission  Medication Sig Dispense Refill Last Dose  . naproxen (NAPROSYN) 500 MG tablet Take 1 tablet (500 mg total) by mouth 2 (two) times daily. (Patient not taking: Reported on 06/14/2019) 30 tablet 0     Musculoskeletal: Strength & Muscle Tone: within normal limits Gait & Station:  normal Patient leans: N/A  Psychiatric Specialty Exam: Physical Exam  Nursing note and vitals reviewed. Constitutional: He is oriented to person, place, and time. He appears well-developed and well-nourished.  HENT:  Head: Normocephalic and atraumatic.  Respiratory: Effort normal.  Neurological: He is alert and oriented to person, place, and time.    Review of Systems  Blood pressure 116/74, pulse (!) 47, temperature 98.2 F (36.8 C), temperature source Oral, resp. rate 16, height  (1.803 m), weight 61.2 kg, SpO2 98 %.Body mass index is 18.83 kg/m.  General Appearance: Casual  Eye Contact:  Fair  Speech:  Normal Rate  Volume:  Normal  Mood:  Anxious  Affect:  Congruent  Thought Process:  Coherent and Descriptions of Associations: Intact  Orientation:  Full (Time, Place, and Person)  Thought Content:  Logical  Suicidal Thoughts:  No  Homicidal Thoughts:  No  Memory:  Immediate;   Fair Recent;    Fair Remote;   Fair  Judgement:  Intact  Insight:  Fair  Psychomotor Activity:  Normal  Concentration:  Concentration: Fair and Attention Span: Fair  Recall:  [employer] of Knowledge:  Fair  Language:  Good  Akathisia:  Negative  Handed:  Right  AIMS (if indicated):     Assets:  Desire for Improvement Housing Resilience Social Support  ADL's:  Intact  Cognition:  WNL  Sleep:  Number of Hours: 4    Treatment Plan Summary: Daily contact with patient to assess and evaluate symptoms and progress in treatment, Medication management and Plan : Patient is seen and examined.  Patient is a 23 year old male with the above-stated past psychiatric history who was admitted to the psychiatric hospital secondary to an intentional overdose of Advil.  He will be admitted to the hospital.  He will be integrated into the milieu.  He will be encouraged to attend groups.  He will be monitored for alcohol withdrawal symptoms.  He will be started on Celexa 10 mg p.o. daily for depression and anxiety.  We will contact his family for collateral information over what occurred.  He will have lorazepam 1 mg p.o. every 6 hours as needed a CIWA greater than 10.  He will also be started on folic acid as well as thiamine.  Given the nonsteroidal overdose he will be placed on Protonix 40 mg p.o. daily for gastric protection.  Observation Level/Precautions:  15 minute checks  Laboratory:  Chemistry Profile  Psychotherapy:    Medications:    Consultations:    Discharge Concerns:    Estimated LOS:  Other:     Physician Treatment Plan for Primary Diagnosis: <principal problem not specified> Long Term Goal(s): Improvement in symptoms so as ready for discharge  Short Term Goals: Ability to identify changes in lifestyle to reduce recurrence of condition will improve, Ability to verbalize feelings will improve, Ability to disclose and discuss suicidal ideas, Ability to demonstrate self-control will improve, Ability to  identify and develop effective coping behaviors will improve, Ability to maintain clinical measurements within normal limits will improve, Compliance with prescribed medications will improve and Ability to identify triggers associated with substance abuse/mental health issues will improve  Physician Treatment Plan for Secondary Diagnosis: Active Problems:   MDD (major depressive disorder)   Substance induced mood disorder (HCC)  Long Term Goal(s): Improvement in symptoms so as ready for discharge  Short Term Goals: Ability to identify changes in lifestyle to reduce recurrence of condition will improve, Ability to verbalize feelings will improve,  Ability to disclose and discuss suicidal ideas, Ability to demonstrate self-control will improve, Ability to identify and develop effective coping behaviors will improve, Ability to maintain clinical measurements within normal limits will improve, Compliance with prescribed medications will improve and Ability to identify triggers associated with substance abuse/mental health issues will improve  I certify that inpatient services furnished can reasonably be expected to improve the patient's condition.    Antonieta Pert, MD 12/19/202012:13 PM

## 2019-06-15 NOTE — Progress Notes (Signed)
Patient ID: Bryce Pace, male   DOB: 20-Apr-1996, 23 y.o.   MRN: [id number]  [place] NOVEL CORONAVIRUS (COVID-19) DAILY CHECK-OFF SYMPTOMS - answer yes or no to each - every day NO YES  Have you had a fever in the past 24 hours?  . Fever (Temp > 37.80C / 100F) X   Have you had any of these symptoms in the past 24 hours? . New Cough .  Sore Throat  .  Shortness of Breath .  Difficulty Breathing .  Unexplained Body Aches   X   Have you had any one of these symptoms in the past 24 hours not related to allergies?   . Runny Nose .  Nasal Congestion .  Sneezing   X   If you have had runny nose, nasal congestion, sneezing in the past 24 hours, has it worsened?  X   EXPOSURES - check yes or no X   Have you traveled outside the state in the past 14 days?  X   Have you been in contact with someone with a confirmed diagnosis of COVID-19 or PUI in the past 14 days without wearing appropriate PPE?  X   Have you been living in the same home as a person with confirmed diagnosis of COVID-19 or a PUI (household contact)?    X   Have you been diagnosed with COVID-19?    X              What to do next: Answered NO to all: Answered YES to anything:   Proceed with unit schedule Follow the [facility] Inpatient Flowsheet.

## 2019-06-15 NOTE — Progress Notes (Signed)
Patient ID: Bryce Pace, male   DOB: 15-Jan-1996, 23 y.o.   MRN: [id number]   D: Spoke with patient tonight and he reports that his stomach pain is much better. Patient eating and drinking with no issues. Smiling and interacting with other peers in the dayroom. Offered maalox if he needs it to coat the stomach. No active SI tonight and contracts on the unit.  A: Staff will continue to monitor patient, give medications as ordered, and follow treatment plan. R: Patient calm and cooperative on the unit.

## 2019-06-15 NOTE — B[facility] Group Notes (Signed)
Adult Psychoeducational Group Note  Date:  06/15/2019 Time:  10:46 PM  Group Topic/Focus:  Wrap-Up Group:   The focus of this group is to help patients review their daily goal of treatment and discuss progress on daily workbooks.  Participation Level:  Active  Participation Quality:  Appropriate, Inattentive and Redirectable  Affect:  Appropriate  Cognitive:  Alert and Appropriate  Insight: Appropriate and Good  Engagement in Group:  Distracting  Modes of Intervention:  Discussion and Education  Additional Comments:  Pt attended and participated in wrap up group this evening and rated their day a 7/10. Pt had a good day, cue to them opening up more and being more comfortable. Pt completed their goal, which was to "be happy".   Cristi Loron 06/15/2019, 10:46 PM

## 2019-06-16 MED ORDER — CITALOPRAM HYDROBROMIDE 10 MG PO TABS: 10.0000 mg | ORAL_TABLET | Freq: Every day | ORAL | 0 refills | Status: DC

## 2019-06-16 MED ORDER — HYDROXYZINE HCL 25 MG PO TABS: 25.0000 mg | ORAL_TABLET | Freq: Three times a day (TID) | ORAL | 0 refills | Status: DC | PRN

## 2019-06-16 MED ORDER — NICOTINE 21 MG/24HR TD PT24: 21.0000 mg | MEDICATED_PATCH | Freq: Every day | TRANSDERMAL | 0 refills | Status: DC

## 2019-06-16 NOTE — BHH Suicide Risk Assessment (Signed)
[place] INPATIENT:  Family/Significant Other Suicide Prevention Education  Suicide Prevention Education:  Education Completed; Brother Fernande Boyden [phone],,  (name of family member/significant other) has been identified by the patient as the family member/significant other with whom the patient will be residing, and identified as the person(s) who will aid the patient in the event of a mental health crisis (suicidal ideations/suicide attempt).  With written consent from the patient, the family member/significant other has been provided the following suicide prevention education, prior to the and/or following the discharge of the patient.  Brother returned phone call, and wanted to know everything possible to enable him to support his brother from [place].  The information about patient's medications and follow-up were discussed in full along with SPE.  Brother was also provided with Mobile Crisis number.  The suicide prevention education provided includes the following:  Suicide risk factors  Suicide prevention and interventions  National Suicide Hotline telephone number  [facility] assessment telephone number  [facility] Emergency Assistance [place] and/or Residential Mobile Crisis Unit telephone number  Request made of family/significant other to:  Remove weapons (e.g., guns, rifles, knives), all items previously/currently identified as safety concern.    Remove drugs/medications (over-the-counter, prescriptions, illicit drugs), all items previously/currently identified as a safety concern.  The family member/significant other verbalizes understanding of the suicide prevention education information provided.  The family member/significant other agrees to remove the items of safety concern listed above.  Berlin Hun Grossman-Orr 06/16/2019, 10:23 AM

## 2019-06-16 NOTE — BHH Group Notes (Signed)
[place] Group Notes:  (Nursing/MHT/Case Management/Adjunct)  Date:  06/16/2019  Time:  10:30 am  Type of Therapy:  Nurse Education Discussed coping skills for mental health disorders and implemented art activities.   Participation Level:  Active  Participation Quality:  Appropriate  Affect:  Appropriate  Cognitive:  Appropriate  Insight:  Appropriate  Engagement in Group:  Engaged  Modes of Intervention:  Activity and Discussion  Summary of Progress/Problems:  Bryce Pace 06/16/2019, 1:45 PM

## 2019-06-16 NOTE — Progress Notes (Cosign Needed)
  [facility] Adult Case Management Discharge Plan :  Will you be returning to the same living situation after discharge:  No.  Will be going to stay with cousin while he gets his thoughts together At discharge, do you have transportation home?: Yes,  85 mother will transport Do you have the ability to pay for your medications: Yes,  Income and insurance  Release of information consent forms completed and emailed to Medical Records, then turned in to Medical Records by CSW.  Patient to Follow up at: Follow-up Information    [facility] Follow up.   Why: Weekday social worker will call to make a hospital discharge appointment, and call patient on Monday at [phone] with that information.  If you do not hear within 2 days, please go to [facility] any day Monday-Friday 8am-5pm. Contact information: [address] [place] [address] [phone]           Next level of care provider has access to [place] and Suicide Prevention discussed: Yes,  with baby's mother     Has patient been referred to the Quitline?: Patient refused referral  Patient has been referred for addiction treatment: Yes  Maretta Los, LCSW 06/16/2019, 9:19 AM

## 2019-06-16 NOTE — BHH Suicide Risk Assessment (Signed)
[place] INPATIENT:  Family/Significant Other Suicide Prevention Education  Suicide Prevention Education:  Education Completed; 57 mother Bryce Pace (girlfriend) [phone],  (name of family member/significant other) has been identified by the patient as the family member/significant other with whom the patient will be residing, and identified as the person(s) who will aid the patient in the event of a mental health crisis (suicidal ideations/suicide attempt).  With written consent from the patient, the family member/significant other has been provided the following suicide prevention education, prior to the and/or following the discharge of the patient.  Girlfriend wanted to know about patient's medication that has been prescribed.  She was informed about his discharge plan to follow up with [facility].  The suicide prevention education provided includes the following:  Suicide risk factors  Suicide prevention and interventions  National Suicide Hotline telephone number  [facility] assessment telephone number  [facility] Emergency Assistance [place] and/or Residential Mobile Crisis Unit telephone number  Request made of family/significant other to:  Remove weapons (e.g., guns, rifles, knives), all items previously/currently identified as safety concern.    Remove drugs/medications (over-the-counter, prescriptions, illicit drugs), all items previously/currently identified as a safety concern.  The family member/significant other verbalizes understanding of the suicide prevention education information provided.  The family member/significant other agrees to remove the items of safety concern listed above.  Bryce Pace 06/16/2019, 9:30 AM

## 2019-06-16 NOTE — Discharge Summary (Signed)
Physician Discharge Summary Note  Patient:  Bryce Pace is an 23 y.o., male  MRN:  [id number]  DOB:  August 26, 1995  Patient phone:  [phone] (home)   Patient address:   [address] [place] [address],   Total Time spent with patient: Greater than 30 minutes  Date of Admission:  06/14/2019  Date of Discharge: 06-16-19  Reason for Admission: Intentional overdose on Ibuprofen.  Principal Problem: MDD (major depressive disorder)  Discharge Diagnoses: Principal Problem:   MDD (major depressive disorder) Active Problems:   Substance induced mood disorder (HCC)  Past Psychiatric History: Major depressive disorder, Substance induced mood disorder.  Past Medical History: History reviewed. No pertinent past medical history. History reviewed. No pertinent surgical history.  Family History: History reviewed. No pertinent family history.  Family Psychiatric  History: See H&P  Social History:  Social History   Substance and Sexual Activity  Alcohol Use Yes     Social History   Substance and Sexual Activity  Drug Use Never    Social History   Socioeconomic History  . Marital status: Single    Spouse name: Not on file  . Number of children: Not on file  . Years of education: Not on file  . Highest education level: Not on file  Occupational History  . Not on file  Tobacco Use  . Smoking status: Current Every Day Smoker  . Smokeless tobacco: Never Used  Substance and Sexual Activity  . Alcohol use: Yes  . Drug use: Never  . Sexual activity: Not on file  Other Topics Concern  . Not on file  Social History Narrative  . Not on file   Social Determinants of Health   Financial Resource Strain:   . Difficulty of Paying Living Expenses: Not on file  Food Insecurity:   . Worried About Programme researcher, broadcasting/film/video in the Last Year: Not on file  . Ran Out of Food in the Last Year: Not on file  Transportation Needs:   . Lack of Transportation (Medical): Not on file  .  Lack of Transportation (Non-Medical): Not on file  Physical Activity:   . Days of Exercise per Week: Not on file  . Minutes of Exercise per Session: Not on file  Stress:   . Feeling of Stress : Not on file  Social Connections:   . Frequency of Communication with Friends and Family: Not on file  . Frequency of Social Gatherings with Friends and Family: Not on file  . Attends Religious Services: Not on file  . Active Member of Clubs or Organizations: Not on file  . Attends Banker Meetings: Not on file  . Marital Status: Not on file   Hospital Course: (Per Md's admission notes): Patient is a 23 year old male with a past psychiatric history significant for alcohol use disorder and marijuana use disorder who presented to the [facility] emergency department on 06/14/2019 after an intentional overdose of ibuprofen. The patient stated while he was in the emergency room that he did it because "life is so hard" and "everybody hates me". The patient admitted that he had tried to hurt himself when he was a child, but nothing recently. Review of the electronic medical record revealed a visit earlier this year in which his blood alcohol was at 140, and on this admission his blood alcohol was at 57. His drug screen was also positive for cannabis. He stated he had used drugs and alcohol to treat his depression by self-medication.  We discussed that. He denied any previous history of alcohol withdrawal syndrome.   After the above admission evaluation, it was recommended based on his presenting symptoms that Brittney will benefit from mood stabilization treatment. And with his consent, he was started on the medication regimen that purposefully targeted his symptoms. He was instructed & explained the benefit/adverse effects of the medication in use. He was given the time to ask questions and voice any concerns that he may have. He received, stabilized & was discharged on the  medications as listed below on his discharge medication lists. He was also enrolled & participated in the group counseling sessions being offered and held on this unit. Kip learned coping skills that should help him after discharge to cope better & maintain mood stability/sobriety.  Terez's symptoms responded well to his treatment regimen. This is evidenced by his reports of improved mood, absence of suicidal ideations, homicidal ideations & or AV hallucinations. He is currently mentally & medically stable to be discharged to continue mental health care, medication management & substance abuse treatment as noted below.  During the course of his hospitalization, the 15-minute checks were adequate to ensure patient's safety. Patient did not display any dangerous, violent or suicidal behavior on the unit.  He interacted with patients & staff appropriately and participated appropriately in the group therapy sessions.  His medications were addressed & adjusted to meet his needs.  At the time of discharge patient is not reporting any acute suicidal ideation & feels more confident about his self-care and managing suicidal thoughts/mental health.  Denies homicidal ideations.  Education and supportive counseling provided. He was able to engage in safety planning including plan to return to [facility] or contact emergency services if he feels unable to maintain his own safety or the safety of others. Pt had no further questions, comments, or concerns. He left [facility] with all personal belongings in no apparent distress. Transportation per his baby-mama.  Physical Findings: AIMS: Facial and Oral Movements Muscles of Facial Expression: None, normal Lips and Perioral Area: None, normal Jaw: None, normal Tongue: None, normal,Extremity Movements Upper (arms, wrists, hands, fingers): None, normal Lower (legs, knees, ankles, toes): None, normal, Trunk Movements Neck, shoulders, hips: None, normal, Overall Severity Severity of  abnormal movements (highest score from questions above): None, normal Incapacitation due to abnormal movements: None, normal Patient's awareness of abnormal movements (rate only patient's report): No Awareness, Dental Status Current problems with teeth and/or dentures?: No Does patient usually wear dentures?: No  CIWA:    COWS:     Musculoskeletal: Strength & Muscle Tone: within normal limits Gait & Station: normal Patient leans: N/A  Psychiatric Specialty Exam: Physical Exam  Nursing note and vitals reviewed. Constitutional: He is oriented to person, place, and time.  Cardiovascular: Normal rate.  Respiratory: Effort normal.  Genitourinary:    Genitourinary Comments: Deferred   Musculoskeletal:        General: Normal range of motion.     Cervical back: Normal range of motion.  Neurological: He is alert and oriented to person, place, and time.  Skin: Skin is warm and dry.    Review of Systems  Constitutional: Negative for chills, diaphoresis and fever.  HENT: Negative for congestion, sneezing and sore throat.   Respiratory: Negative for cough, shortness of breath and wheezing.   Cardiovascular: Negative for chest pain and palpitations.  Gastrointestinal: Negative for diarrhea, nausea and vomiting.  Musculoskeletal: Negative for myalgias.  Skin: Negative for color change.  Neurological: Negative for tremors,  seizures and numbness.  Psychiatric/Behavioral: Negative for agitation, behavioral problems, confusion, decreased concentration, dysphoric mood, hallucinations, self-injury, sleep disturbance and suicidal ideas. The patient is not nervous/anxious and is not hyperactive.     Blood pressure (!) 139/93, pulse (!) 55, temperature 97.9 F (36.6 C), temperature source Oral, resp. rate 16, height 5\' 11"  (1.803 m), weight 61.2 kg, SpO2 100 %.Body mass index is 18.83 kg/m.  See Md's discharge SRA   Has this patient used any form of tobacco in the last 30 days? (Cigarettes,  Smokeless Tobacco, Cigars, and/or Pipes): Yes, FDA-approved tobacco cessation medication was offered at discharge.  Blood Alcohol level:  Lab Results  Component Value Date   ETH 57 (H) 06/14/2019   ETH 140 (H) 02/15/2019   Metabolic Disorder Labs:  No results found for: HGBA1C, MPG No results found for: PROLACTIN No results found for: CHOL, TRIG, HDL, CHOLHDL, VLDL, LDLCALC  See Psychiatric Specialty Exam and Suicide Risk Assessment completed by Attending Physician prior to discharge.  Discharge destination:  Home  Is patient on multiple antipsychotic therapies at discharge:  No   Has Patient had three or more failed trials of antipsychotic monotherapy by history:  No  Recommended Plan for Multiple Antipsychotic Therapies: NA  Allergies as of 06/16/2019   No Known Allergies     Medication List    STOP taking these medications   naproxen 500 MG tablet Commonly known as: NAPROSYN     TAKE these medications     Indication  citalopram 10 MG tablet Commonly known as: CELEXA Take 1 tablet (10 mg total) by mouth daily. For depression Start taking on: June 17, 2019  Indication: Depression   hydrOXYzine 25 MG tablet Commonly known as: ATARAX/VISTARIL Take 1 tablet (25 mg total) by mouth 3 (three) times daily as needed for anxiety.  Indication: Feeling Anxious   nicotine 21 mg/24hr patch Commonly known as: NICODERM CQ - dosed in mg/24 hours Place 1 patch (21 mg total) onto the skin daily. (May buy from over the counter): For smoking cessation Start taking on: June 17, 2019  Indication: Nicotine Addiction      Follow-up recommendations: Activity:  As tolerated Diet: As recommended by your primary care doctor. Keep all scheduled follow-up appointments as recommended.  Comments: Prescriptions given at discharge.  Patient agreeable to plan.  Given opportunity to ask questions.  Appears to feel comfortable with discharge denies any current suicidal or homicidal  thought. Patient is also instructed prior to discharge to: Take all medications as prescribed by his/her mental healthcare provider. Report any adverse effects and or reactions from the medicines to his/her outpatient provider promptly. Patient has been instructed & cautioned: To not engage in alcohol and or illegal drug use while on prescription medicines. In the event of worsening symptoms, patient is instructed to call the crisis hotline, 911 and or go to the nearest ED for appropriate evaluation and treatment of symptoms. To follow-up with his/her primary care provider for your other medical issues, concerns and or health care needs.  Signed: Armandina StammerAgnes Abou Sterkel, NP, PMHNP, FNP-BC 06/16/2019, 8:57 AM

## 2019-06-16 NOTE — Progress Notes (Signed)
Patient ID: Zekiel Gartrell, male   DOB: 01/16/1996, 23 y.o.   MRN: [id number]  [place] NOVEL CORONAVIRUS (COVID-19) DAILY CHECK-OFF SYMPTOMS - answer yes or no to each - every day NO YES  Have you had a fever in the past 24 hours?  . Fever (Temp > 37.80C / 100F) X   Have you had any of these symptoms in the past 24 hours? . New Cough .  Sore Throat  .  Shortness of Breath .  Difficulty Breathing .  Unexplained Body Aches   X   Have you had any one of these symptoms in the past 24 hours not related to allergies?   . Runny Nose .  Nasal Congestion .  Sneezing   X   If you have had runny nose, nasal congestion, sneezing in the past 24 hours, has it worsened?  X   EXPOSURES - check yes or no X   Have you traveled outside the state in the past 14 days?  X   Have you been in contact with someone with a confirmed diagnosis of COVID-19 or PUI in the past 14 days without wearing appropriate PPE?  X   Have you been living in the same home as a person with confirmed diagnosis of COVID-19 or a PUI (household contact)?    X   Have you been diagnosed with COVID-19?    X              What to do next: Answered NO to all: Answered YES to anything:   Proceed with unit schedule Follow the [facility] Inpatient Flowsheet.   

## 2019-06-16 NOTE — BHH Suicide Risk Assessment (Signed)
[name] INPATIENT:  Family/Significant Other Suicide Prevention Education  Suicide Prevention Education:  Contact Attempts: Brother Bryce Pace [phone], (name of family member/significant other) has been identified by the patient as the family member/significant other with whom the patient will be residing, and identified as the person(s) who will aid the patient in the event of a mental health crisis.  With written consent from the patient, two attempts were made to provide suicide prevention education, prior to and/or following the patient's discharge.  We were unsuccessful in providing suicide prevention education.  A suicide education pamphlet was given to the patient to share with family/significant other.  Date and time of first attempt:  06/16/2019  / 9:20am Date and time of second attempt:  Successful with baby's mother  Bryce Pace 06/16/2019, 9:29 AM

## 2019-06-16 NOTE — BHH Suicide Risk Assessment (Signed)
[facility] Discharge Suicide Risk Assessment   Principal Problem: MDD (major depressive disorder) Discharge Diagnoses: Principal Problem:   MDD (major depressive disorder) Active Problems:   Substance induced mood disorder ([place])   Total Time spent with patient: 20 minutes  Musculoskeletal: Strength & Muscle Tone: within normal limits Gait & Station: normal Patient leans: N/A  Psychiatric Specialty Exam: Review of Systems  All other systems reviewed and are negative.   Blood pressure (!) 139/93, pulse (!) 55, temperature 97.9 F (36.6 C), temperature source Oral, resp. rate 16, height 5\' 11"  (1.803 m), weight 61.2 kg, SpO2 100 %.Body mass index is 18.83 kg/m.  General Appearance: Casual  Eye Contact::  Good  Speech:  Normal Rate409  Volume:  Normal  Mood:  Euthymic  Affect:  Congruent  Thought Process:  Coherent and Descriptions of Associations: Intact  Orientation:  Full (Time, Place, and Person)  Thought Content:  Logical  Suicidal Thoughts:  No  Homicidal Thoughts:  No  Memory:  Immediate;   Fair Recent;   Fair Remote;   Fair  Judgement:  Fair  Insight:  Good  Psychomotor Activity:  Normal  Concentration:  Good  Recall:  Good  Fund of Knowledge:Good  Language: Good  Akathisia:  Negative  Handed:  Right  AIMS (if indicated):     Assets:  Communication Skills Desire for Improvement Resilience Social Support Talents/Skills  Sleep:  Number of Hours: 4.75  Cognition: WNL  ADL's:  Intact   Mental Status Per Nursing Assessment::   On Admission:  Self-harm thoughts  Demographic Factors:  Male  Loss Factors: Financial problems/change in socioeconomic status  Historical Factors: Impulsivity  Risk Reduction Factors:   Responsible for children under 26 years of age, Sense of responsibility to family, Employed, Living with another person, especially a relative and Positive social support  Continued Clinical Symptoms:  Depression:   Impulsivity  Cognitive  Features That Contribute To Risk:  None    Suicide Risk:  Minimal: No identifiable suicidal ideation.  Patients presenting with no risk factors but with morbid ruminations; may be classified as minimal risk based on the severity of the depressive symptoms    Plan Of Care/Follow-up recommendations:  Activity:  ad lib Diet:  regular  Sharma Covert, MD 06/16/2019, 9:00 AM

## 2019-06-16 NOTE — Progress Notes (Signed)
Patient ID: Bryce Pace, male   DOB: 1996-01-05, 23 y.o.   MRN: [id number]   D: Pt alert and oriented on the unit.   A: Education, support, and encouragement provided. Discharge summary, medications and follow up appointments reviewed with pt. Suicide prevention resources provided, including "My 3 App." Pt's belongings in locker # 23 eturned and belongings sheet signed.  R: Pt denies SI/HI, A/VH, pain, or any concerns at this time. Pt ambulatory on and off unit. Pt discharged to lobby.

## 2019-06-18 ENCOUNTER — Emergency Department (HOSPITAL_COMMUNITY): Payer: Managed Care, Other (non HMO)

## 2019-06-18 ENCOUNTER — Emergency Department (HOSPITAL_COMMUNITY): Payer: Managed Care, Other (non HMO) | Admitting: Certified Registered"

## 2019-06-18 ENCOUNTER — Inpatient Hospital Stay (HOSPITAL_COMMUNITY): Payer: Managed Care, Other (non HMO)

## 2019-06-18 ENCOUNTER — Encounter (HOSPITAL_COMMUNITY): Payer: Self-pay

## 2019-06-18 ENCOUNTER — Inpatient Hospital Stay (HOSPITAL_COMMUNITY)
Admission: EM | Admit: 2019-06-18 | Discharge: 2019-06-29 | DRG: 957 | Disposition: A | Discharge status: Home / Self Care | Payer: Managed Care, Other (non HMO) | Attending: Thoracic Surgery (Cardiothoracic Vascular Surgery) | Admitting: Thoracic Surgery (Cardiothoracic Vascular Surgery)

## 2019-06-18 ENCOUNTER — Encounter (HOSPITAL_COMMUNITY): Admission: EM | Disposition: A | Discharge status: Home / Self Care | Payer: Self-pay | Source: Home / Self Care

## 2019-06-18 ENCOUNTER — Other Ambulatory Visit: Payer: Self-pay

## 2019-06-18 DIAGNOSIS — I469 Cardiac arrest, cause unspecified: Secondary | ICD-10-CM | POA: Diagnosis present

## 2019-06-18 DIAGNOSIS — Z9689 Presence of other specified functional implants: Secondary | ICD-10-CM

## 2019-06-18 DIAGNOSIS — W3400XA Accidental discharge from unspecified firearms or gun, initial encounter: Secondary | ICD-10-CM

## 2019-06-18 DIAGNOSIS — S21131A Puncture wound without foreign body of right front wall of thorax without penetration into thoracic cavity, initial encounter: Secondary | ICD-10-CM | POA: Diagnosis present

## 2019-06-18 DIAGNOSIS — Z419 Encounter for procedure for purposes other than remedying health state, unspecified: Secondary | ICD-10-CM

## 2019-06-18 DIAGNOSIS — S21132A Puncture wound without foreign body of left front wall of thorax without penetration into thoracic cavity, initial encounter: Secondary | ICD-10-CM | POA: Diagnosis present

## 2019-06-18 DIAGNOSIS — S52209A Unspecified fracture of shaft of unspecified ulna, initial encounter for closed fracture: Secondary | ICD-10-CM

## 2019-06-18 DIAGNOSIS — E872 Acidosis: Secondary | ICD-10-CM | POA: Diagnosis not present

## 2019-06-18 DIAGNOSIS — T796XXA Traumatic ischemia of muscle, initial encounter: Secondary | ICD-10-CM | POA: Diagnosis not present

## 2019-06-18 DIAGNOSIS — I10 Essential (primary) hypertension: Secondary | ICD-10-CM | POA: Diagnosis not present

## 2019-06-18 DIAGNOSIS — I96 Gangrene, not elsewhere classified: Secondary | ICD-10-CM | POA: Diagnosis present

## 2019-06-18 DIAGNOSIS — J9690 Respiratory failure, unspecified, unspecified whether with hypoxia or hypercapnia: Secondary | ICD-10-CM

## 2019-06-18 DIAGNOSIS — E861 Hypovolemia: Secondary | ICD-10-CM | POA: Diagnosis present

## 2019-06-18 DIAGNOSIS — S25891A Other specified injury of other blood vessels of thorax, right side, initial encounter: Secondary | ICD-10-CM | POA: Diagnosis present

## 2019-06-18 DIAGNOSIS — S52291A Other fracture of shaft of right ulna, initial encounter for closed fracture: Secondary | ICD-10-CM | POA: Diagnosis present

## 2019-06-18 DIAGNOSIS — I313 Pericardial effusion (noninflammatory): Secondary | ICD-10-CM | POA: Diagnosis not present

## 2019-06-18 DIAGNOSIS — S65111A Laceration of radial artery at wrist and hand level of right arm, initial encounter: Secondary | ICD-10-CM | POA: Diagnosis present

## 2019-06-18 DIAGNOSIS — D62 Acute posthemorrhagic anemia: Secondary | ICD-10-CM | POA: Diagnosis present

## 2019-06-18 DIAGNOSIS — S51831A Puncture wound without foreign body of right forearm, initial encounter: Secondary | ICD-10-CM

## 2019-06-18 DIAGNOSIS — S52251C Displaced comminuted fracture of shaft of ulna, right arm, initial encounter for open fracture type IIIA, IIIB, or IIIC: Secondary | ICD-10-CM | POA: Diagnosis present

## 2019-06-18 DIAGNOSIS — S41131A Puncture wound without foreign body of right upper arm, initial encounter: Secondary | ICD-10-CM

## 2019-06-18 DIAGNOSIS — Z20822 Contact with and (suspected) exposure to covid-19: Secondary | ICD-10-CM | POA: Diagnosis present

## 2019-06-18 DIAGNOSIS — S27332A Laceration of lung, bilateral, initial encounter: Secondary | ICD-10-CM | POA: Diagnosis present

## 2019-06-18 DIAGNOSIS — D696 Thrombocytopenia, unspecified: Secondary | ICD-10-CM | POA: Diagnosis not present

## 2019-06-18 DIAGNOSIS — S52021C Displaced fracture of olecranon process without intraarticular extension of right ulna, initial encounter for open fracture type IIIA, IIIB, or IIIC: Secondary | ICD-10-CM | POA: Diagnosis present

## 2019-06-18 DIAGNOSIS — I959 Hypotension, unspecified: Secondary | ICD-10-CM | POA: Diagnosis present

## 2019-06-18 DIAGNOSIS — Z4682 Encounter for fitting and adjustment of non-vascular catheter: Secondary | ICD-10-CM

## 2019-06-18 DIAGNOSIS — S55191A Other specified injury of radial artery at forearm level, right arm, initial encounter: Secondary | ICD-10-CM

## 2019-06-18 DIAGNOSIS — S52291C Other fracture of shaft of right ulna, initial encounter for open fracture type IIIA, IIIB, or IIIC: Secondary | ICD-10-CM

## 2019-06-18 DIAGNOSIS — Z09 Encounter for follow-up examination after completed treatment for conditions other than malignant neoplasm: Secondary | ICD-10-CM

## 2019-06-18 DIAGNOSIS — Z4659 Encounter for fitting and adjustment of other gastrointestinal appliance and device: Secondary | ICD-10-CM

## 2019-06-18 DIAGNOSIS — I319 Disease of pericardium, unspecified: Secondary | ICD-10-CM | POA: Diagnosis not present

## 2019-06-18 DIAGNOSIS — S27399A Other injuries of lung, unspecified, initial encounter: Secondary | ICD-10-CM

## 2019-06-18 DIAGNOSIS — J939 Pneumothorax, unspecified: Secondary | ICD-10-CM

## 2019-06-18 HISTORY — PX: THORACOTOMY: SHX5074

## 2019-06-18 HISTORY — PX: APPLICATION OF WOUND VAC: SHX5189

## 2019-06-18 HISTORY — PX: WOUND EXPLORATION: SHX6188

## 2019-06-18 HISTORY — PX: FASCIECTOMY: SHX6525

## 2019-06-18 HISTORY — PX: MEDIASTINAL EXPLORATION: SHX5065

## 2019-06-18 HISTORY — PX: MEDIASTERNOTOMY: SHX5084

## 2019-06-18 LAB — COMPREHENSIVE METABOLIC PANEL
ALT: 100 U/L — ABNORMAL HIGH (ref 0–44)
ALT: 122 U/L — ABNORMAL HIGH (ref 0–44)
ALT: 140 U/L — ABNORMAL HIGH (ref 0–44)
AST: 103 U/L — ABNORMAL HIGH (ref 15–41)
AST: 175 U/L — ABNORMAL HIGH (ref 15–41)
AST: 208 U/L — ABNORMAL HIGH (ref 15–41)
Albumin: 2.4 g/dL — ABNORMAL LOW (ref 3.5–5.0)
Albumin: 3.2 g/dL — ABNORMAL LOW (ref 3.5–5.0)
Albumin: 3.1 g/dL — ABNORMAL LOW (ref 3.5–5.0)
Alkaline Phosphatase: 45 U/L (ref 38–126)
Alkaline Phosphatase: 41 U/L (ref 38–126)
Alkaline Phosphatase: 41 U/L (ref 38–126)
Anion gap: 13 (ref 5–15)
Anion gap: 15 (ref 5–15)
BUN: 12 mg/dL (ref 6–20)
BUN: 10 mg/dL (ref 6–20)
BUN: 18 mg/dL (ref 6–20)
CO2: <7 mmol/L — ABNORMAL LOW (ref 22–32)
CO2: 31 mmol/L (ref 22–32)
CO2: 29 mmol/L (ref 22–32)
Calcium: 9.2 mg/dL (ref 8.9–10.3)
Calcium: 10.7 mg/dL — ABNORMAL HIGH (ref 8.9–10.3)
Calcium: 9.9 mg/dL (ref 8.9–10.3)
Chloride: 104 mmol/L (ref 98–111)
Chloride: 110 mmol/L (ref 98–111)
Chloride: 107 mmol/L (ref 98–111)
Creatinine, Ser: 1.69 mg/dL — ABNORMAL HIGH (ref 0.61–1.24)
Creatinine, Ser: 1.32 mg/dL — ABNORMAL HIGH (ref 0.61–1.24)
Creatinine, Ser: 1.9 mg/dL — ABNORMAL HIGH (ref 0.61–1.24)
GFR calc Af Amer: >60 mL/min (ref >60)
GFR calc Af Amer: >60 mL/min (ref >60)
GFR calc Af Amer: 57 mL/min — ABNORMAL LOW (ref >60)
GFR calc non Af Amer: 56 mL/min — ABNORMAL LOW (ref >60)
GFR calc non Af Amer: >60 mL/min (ref >60)
GFR calc non Af Amer: 49 mL/min — ABNORMAL LOW (ref >60)
Glucose, Bld: 448 mg/dL — ABNORMAL HIGH (ref 70–99)
Glucose, Bld: 79 mg/dL (ref 70–99)
Glucose, Bld: 78 mg/dL (ref 70–99)
Potassium: 5.4 mmol/L — ABNORMAL HIGH (ref 3.5–5.1)
Potassium: 3.8 mmol/L (ref 3.5–5.1)
Potassium: 4.2 mmol/L (ref 3.5–5.1)
Sodium: 151 mmol/L — ABNORMAL HIGH (ref 135–145)
Sodium: 154 mmol/L — ABNORMAL HIGH (ref 135–145)
Sodium: 151 mmol/L — ABNORMAL HIGH (ref 135–145)
Total Bilirubin: 0.2 mg/dL — ABNORMAL LOW (ref 0.3–1.2)
Total Bilirubin: 1.1 mg/dL (ref 0.3–1.2)
Total Bilirubin: 1.9 mg/dL — ABNORMAL HIGH (ref 0.3–1.2)
Total Protein: 3.7 g/dL — ABNORMAL LOW (ref 6.5–8.1)
Total Protein: 5 g/dL — ABNORMAL LOW (ref 6.5–8.1)
Total Protein: 4.9 g/dL — ABNORMAL LOW (ref 6.5–8.1)

## 2019-06-18 LAB — POCT I-STAT 7, (LYTES, BLD GAS, ICA,H+H)
Acid-Base Excess: 1 mmol/L (ref 0.0–2.0)
Acid-Base Excess: 4 mmol/L — ABNORMAL HIGH (ref 0.0–2.0)
Acid-Base Excess: 6 mmol/L — ABNORMAL HIGH (ref 0.0–2.0)
Acid-Base Excess: 7 mmol/L — ABNORMAL HIGH (ref 0.0–2.0)
Acid-Base Excess: 5 mmol/L — ABNORMAL HIGH (ref 0.0–2.0)
Acid-Base Excess: 9 mmol/L — ABNORMAL HIGH (ref 0.0–2.0)
Acid-base deficit: 26 mmol/L — ABNORMAL HIGH (ref 0.0–2.0)
Acid-base deficit: 14 mmol/L — ABNORMAL HIGH (ref 0.0–2.0)
Acid-base deficit: 4 mmol/L — ABNORMAL HIGH (ref 0.0–2.0)
Bicarbonate: 8.3 mmol/L — ABNORMAL LOW (ref 20.0–28.0)
Bicarbonate: 17 mmol/L — ABNORMAL LOW (ref 20.0–28.0)
Bicarbonate: 25.8 mmol/L (ref 20.0–28.0)
Bicarbonate: 30 mmol/L — ABNORMAL HIGH (ref 20.0–28.0)
Bicarbonate: 30.6 mmol/L — ABNORMAL HIGH (ref 20.0–28.0)
Bicarbonate: 32.7 mmol/L — ABNORMAL HIGH (ref 20.0–28.0)
Bicarbonate: 33.9 mmol/L — ABNORMAL HIGH (ref 20.0–28.0)
Bicarbonate: 34.8 mmol/L — ABNORMAL HIGH (ref 20.0–28.0)
Bicarbonate: 34.7 mmol/L — ABNORMAL HIGH (ref 20.0–28.0)
Calcium, Ion: 0.37 mmol/L — CL (ref 1.15–1.40)
Calcium, Ion: 0.85 mmol/L — CL (ref 1.15–1.40)
Calcium, Ion: 0.86 mmol/L — CL (ref 1.15–1.40)
Calcium, Ion: 1.13 mmol/L — ABNORMAL LOW (ref 1.15–1.40)
Calcium, Ion: 1.02 mmol/L — ABNORMAL LOW (ref 1.15–1.40)
Calcium, Ion: 1.58 mmol/L (ref 1.15–1.40)
Calcium, Ion: 1.26 mmol/L (ref 1.15–1.40)
Calcium, Ion: 1.57 mmol/L (ref 1.15–1.40)
Calcium, Ion: 1.3 mmol/L (ref 1.15–1.40)
HCT: 17 % — ABNORMAL LOW (ref 39.0–52.0)
HCT: 35 % — ABNORMAL LOW (ref 39.0–52.0)
HCT: 33 % — ABNORMAL LOW (ref 39.0–52.0)
HCT: 39 % (ref 39.0–52.0)
HCT: 18 % — ABNORMAL LOW (ref 39.0–52.0)
HCT: 36 % — ABNORMAL LOW (ref 39.0–52.0)
HCT: 23 % — ABNORMAL LOW (ref 39.0–52.0)
HCT: 24 % — ABNORMAL LOW (ref 39.0–52.0)
HCT: 31 % — ABNORMAL LOW (ref 39.0–52.0)
Hemoglobin: 5.8 g/dL — CL (ref 13.0–17.0)
Hemoglobin: 11.9 g/dL — ABNORMAL LOW (ref 13.0–17.0)
Hemoglobin: 11.2 g/dL — ABNORMAL LOW (ref 13.0–17.0)
Hemoglobin: 13.3 g/dL (ref 13.0–17.0)
Hemoglobin: 12.2 g/dL — ABNORMAL LOW (ref 13.0–17.0)
Hemoglobin: 6.1 g/dL — CL (ref 13.0–17.0)
Hemoglobin: 7.8 g/dL — ABNORMAL LOW (ref 13.0–17.0)
Hemoglobin: 8.2 g/dL — ABNORMAL LOW (ref 13.0–17.0)
Hemoglobin: 10.5 g/dL — ABNORMAL LOW (ref 13.0–17.0)
O2 Saturation: 100 %
O2 Saturation: 97 %
O2 Saturation: 82 %
O2 Saturation: 94 %
O2 Saturation: 84 %
O2 Saturation: 96 %
O2 Saturation: 93 %
O2 Saturation: 67 %
O2 Saturation: 92 %
Patient temperature: 33.9
Potassium: 3.9 mmol/L (ref 3.5–5.1)
Potassium: 6.4 mmol/L (ref 3.5–5.1)
Potassium: 3.6 mmol/L (ref 3.5–5.1)
Potassium: 7 mmol/L (ref 3.5–5.1)
Potassium: 3.3 mmol/L — ABNORMAL LOW (ref 3.5–5.1)
Potassium: 3.6 mmol/L (ref 3.5–5.1)
Potassium: 3.1 mmol/L — ABNORMAL LOW (ref 3.5–5.1)
Potassium: 3.4 mmol/L — ABNORMAL LOW (ref 3.5–5.1)
Potassium: 3.8 mmol/L (ref 3.5–5.1)
Sodium: 144 mmol/L (ref 135–145)
Sodium: 142 mmol/L (ref 135–145)
Sodium: 144 mmol/L (ref 135–145)
Sodium: 150 mmol/L — ABNORMAL HIGH (ref 135–145)
Sodium: 152 mmol/L — ABNORMAL HIGH (ref 135–145)
Sodium: 153 mmol/L — ABNORMAL HIGH (ref 135–145)
Sodium: 153 mmol/L — ABNORMAL HIGH (ref 135–145)
Sodium: 153 mmol/L — ABNORMAL HIGH (ref 135–145)
Sodium: 152 mmol/L — ABNORMAL HIGH (ref 135–145)
TCO2: 10 mmol/L — ABNORMAL LOW (ref 22–32)
TCO2: 19 mmol/L — ABNORMAL LOW (ref 22–32)
TCO2: 28 mmol/L (ref 22–32)
TCO2: 32 mmol/L (ref 22–32)
TCO2: 32 mmol/L (ref 22–32)
TCO2: 34 mmol/L — ABNORMAL HIGH (ref 22–32)
TCO2: 36 mmol/L — ABNORMAL HIGH (ref 22–32)
TCO2: 38 mmol/L — ABNORMAL HIGH (ref 22–32)
TCO2: 36 mmol/L — ABNORMAL HIGH (ref 22–32)
pCO2 arterial: 71.3 mmHg (ref 32.0–48.0)
pCO2 arterial: 64.1 mmHg — ABNORMAL HIGH (ref 32.0–48.0)
pCO2 arterial: 67.5 mmHg (ref 32.0–48.0)
pCO2 arterial: 71.2 mmHg (ref 32.0–48.0)
pCO2 arterial: 56.1 mmHg — ABNORMAL HIGH (ref 32.0–48.0)
pCO2 arterial: 61.2 mmHg — ABNORMAL HIGH (ref 32.0–48.0)
pCO2 arterial: 63.7 mmHg — ABNORMAL HIGH (ref 32.0–48.0)
pCO2 arterial: 89.9 mmHg (ref 32.0–48.0)
pCO2 arterial: 47.2 mmHg (ref 32.0–48.0)
pH, Arterial: 6.672 — CL (ref 7.350–7.450)
pH, Arterial: 7.03 — CL (ref 7.350–7.450)
pH, Arterial: 7.167 — CL (ref 7.350–7.450)
pH, Arterial: 7.255 — ABNORMAL LOW (ref 7.350–7.450)
pH, Arterial: 7.307 — ABNORMAL LOW (ref 7.350–7.450)
pH, Arterial: 7.374 (ref 7.350–7.450)
pH, Arterial: 7.334 — ABNORMAL LOW (ref 7.350–7.450)
pH, Arterial: 7.196 — CL (ref 7.350–7.450)
pH, Arterial: 7.462 — ABNORMAL HIGH (ref 7.350–7.450)
pO2, Arterial: 511 mmHg — ABNORMAL HIGH (ref 83.0–108.0)
pO2, Arterial: 140 mmHg — ABNORMAL HIGH (ref 83.0–108.0)
pO2, Arterial: 56 mmHg — ABNORMAL LOW (ref 83.0–108.0)
pO2, Arterial: 90 mmHg (ref 83.0–108.0)
pO2, Arterial: 55 mmHg — ABNORMAL LOW (ref 83.0–108.0)
pO2, Arterial: 84 mmHg (ref 83.0–108.0)
pO2, Arterial: 74 mmHg — ABNORMAL LOW (ref 83.0–108.0)
pO2, Arterial: 45 mmHg — ABNORMAL LOW (ref 83.0–108.0)
pO2, Arterial: 53 mmHg — ABNORMAL LOW (ref 83.0–108.0)

## 2019-06-18 LAB — PROTIME-INR
INR: 0.9 (ref 0.8–1.2)
Prothrombin Time: 12.5 seconds (ref 11.4–15.2)

## 2019-06-18 LAB — CBC
HCT: 41.2 % (ref 39.0–52.0)
HCT: 43.7 % (ref 39.0–52.0)
HCT: 33.3 % — ABNORMAL LOW (ref 39.0–52.0)
Hemoglobin: 11.7 g/dL — ABNORMAL LOW (ref 13.0–17.0)
Hemoglobin: 14.4 g/dL (ref 13.0–17.0)
Hemoglobin: 12.1 g/dL — ABNORMAL LOW (ref 13.0–17.0)
MCH: 30.5 pg (ref 26.0–34.0)
MCH: 29.7 pg (ref 26.0–34.0)
MCH: 31.8 pg (ref 26.0–34.0)
MCHC: 28.4 g/dL — ABNORMAL LOW (ref 30.0–36.0)
MCHC: 33 g/dL (ref 30.0–36.0)
MCHC: 36.3 g/dL — ABNORMAL HIGH (ref 30.0–36.0)
MCV: 107.6 fL — ABNORMAL HIGH (ref 80.0–100.0)
MCV: 90.1 fL (ref 80.0–100.0)
MCV: 87.4 fL (ref 80.0–100.0)
Platelets: 85 10*3/uL — ABNORMAL LOW (ref 150–400)
Platelets: 61 10*3/uL — ABNORMAL LOW (ref 150–400)
Platelets: 204 10*3/uL (ref 150–400)
RBC: 3.83 MIL/uL — ABNORMAL LOW (ref 4.22–5.81)
RBC: 4.85 MIL/uL (ref 4.22–5.81)
RBC: 3.81 MIL/uL — ABNORMAL LOW (ref 4.22–5.81)
RDW: 12.7 % (ref 11.5–15.5)
RDW: 14.8 % (ref 11.5–15.5)
RDW: 13.8 % (ref 11.5–15.5)
WBC: 4.6 10*3/uL (ref 4.0–10.5)
WBC: 2.5 10*3/uL — ABNORMAL LOW (ref 4.0–10.5)
WBC: 2.9 10*3/uL — ABNORMAL LOW (ref 4.0–10.5)
nRBC: 0 % (ref 0.0–0.2)
nRBC: 0 % (ref 0.0–0.2)
nRBC: 0 % (ref 0.0–0.2)

## 2019-06-18 LAB — URINALYSIS, ROUTINE W REFLEX MICROSCOPIC
Bacteria, UA: NONE SEEN
Bilirubin Urine: NEGATIVE
Glucose, UA: NEGATIVE mg/dL
Ketones, ur: NEGATIVE mg/dL
Leukocytes,Ua: NEGATIVE
Nitrite: NEGATIVE
Protein, ur: NEGATIVE mg/dL
Specific Gravity, Urine: 1.006 (ref 1.005–1.030)
pH: 7 (ref 5.0–8.0)

## 2019-06-18 LAB — DIC (DISSEMINATED INTRAVASCULAR COAGULATION)PANEL
D-Dimer, Quant: >20 ug/mL-FEU — ABNORMAL HIGH (ref 0.00–0.50)
D-Dimer, Quant: 14.96 ug/mL-FEU — ABNORMAL HIGH (ref 0.00–0.50)
D-Dimer, Quant: 11.22 ug/mL-FEU — ABNORMAL HIGH (ref 0.00–0.50)
D-Dimer, Quant: 10.36 ug/mL-FEU — ABNORMAL HIGH (ref 0.00–0.50)
Fibrinogen: 90 mg/dL — CL (ref 210–475)
Fibrinogen: 306 mg/dL (ref 210–475)
Fibrinogen: 231 mg/dL (ref 210–475)
Fibrinogen: 301 mg/dL (ref 210–475)
INR: 3.2 — ABNORMAL HIGH (ref 0.8–1.2)
INR: 1.6 — ABNORMAL HIGH (ref 0.8–1.2)
INR: 1.5 — ABNORMAL HIGH (ref 0.8–1.2)
INR: 0.9 (ref 0.8–1.2)
Platelets: 82 10*3/uL — ABNORMAL LOW (ref 150–400)
Platelets: 63 10*3/uL — ABNORMAL LOW (ref 150–400)
Platelets: 116 10*3/uL — ABNORMAL LOW (ref 150–400)
Platelets: 86 10*3/uL — ABNORMAL LOW (ref 150–400)
Prothrombin Time: 33.1 seconds — ABNORMAL HIGH (ref 11.4–15.2)
Prothrombin Time: 18.7 seconds — ABNORMAL HIGH (ref 11.4–15.2)
Prothrombin Time: 17.9 seconds — ABNORMAL HIGH (ref 11.4–15.2)
Prothrombin Time: 12.1 seconds (ref 11.4–15.2)
Smear Review: NONE SEEN
Smear Review: NONE SEEN
Smear Review: NONE SEEN
Smear Review: NONE SEEN
aPTT: 156 seconds — ABNORMAL HIGH (ref 24–36)
aPTT: 115 seconds — ABNORMAL HIGH (ref 24–36)
aPTT: 55 seconds — ABNORMAL HIGH (ref 24–36)
aPTT: 49 seconds — ABNORMAL HIGH (ref 24–36)

## 2019-06-18 LAB — MASSIVE TRANSFUSION PROTOCOL ORDER (BLOOD BANK NOTIFICATION)

## 2019-06-18 LAB — LACTIC ACID, PLASMA
Lactic Acid, Venous: >11 mmol/L (ref 0.5–1.9)
Lactic Acid, Venous: 4 mmol/L (ref 0.5–1.9)
Lactic Acid, Venous: 3.2 mmol/L (ref 0.5–1.9)

## 2019-06-18 LAB — BASIC METABOLIC PANEL
Anion gap: 28 — ABNORMAL HIGH (ref 5–15)
BUN: 11 mg/dL (ref 6–20)
CO2: 23 mmol/L (ref 22–32)
Calcium: 14.3 mg/dL (ref 8.9–10.3)
Chloride: 99 mmol/L (ref 98–111)
Creatinine, Ser: 1.43 mg/dL — ABNORMAL HIGH (ref 0.61–1.24)
GFR calc Af Amer: >60 mL/min (ref >60)
GFR calc non Af Amer: >60 mL/min (ref >60)
Glucose, Bld: 383 mg/dL — ABNORMAL HIGH (ref 70–99)
Potassium: 7.1 mmol/L (ref 3.5–5.1)
Sodium: 150 mmol/L — ABNORMAL HIGH (ref 135–145)

## 2019-06-18 LAB — CDS SEROLOGY

## 2019-06-18 LAB — I-STAT CHEM 8, ED
BUN: 12 mg/dL (ref 6–20)
Calcium, Ion: 0.88 mmol/L — CL (ref 1.15–1.40)
Chloride: 111 mmol/L (ref 98–111)
Creatinine, Ser: 1.2 mg/dL (ref 0.61–1.24)
Glucose, Bld: 393 mg/dL — ABNORMAL HIGH (ref 70–99)
HCT: 32 % — ABNORMAL LOW (ref 39.0–52.0)
Hemoglobin: 10.9 g/dL — ABNORMAL LOW (ref 13.0–17.0)
Potassium: 5 mmol/L (ref 3.5–5.1)
Sodium: 144 mmol/L (ref 135–145)
TCO2: 6 mmol/L — ABNORMAL LOW (ref 22–32)

## 2019-06-18 LAB — GLUCOSE, CAPILLARY: Glucose-Capillary: 85 mg/dL (ref 70–99)

## 2019-06-18 LAB — ETHANOL: Alcohol, Ethyl (B): <10 mg/dL (ref <10)

## 2019-06-18 LAB — RESPIRATORY PANEL BY RT PCR (FLU A&B, COVID)
Influenza A by PCR: NEGATIVE
Influenza B by PCR: NEGATIVE
SARS Coronavirus 2 by RT PCR: NEGATIVE

## 2019-06-18 LAB — HEMOGLOBIN AND HEMATOCRIT, BLOOD
HCT: 21.2 % — ABNORMAL LOW (ref 39.0–52.0)
Hemoglobin: 7.2 g/dL — ABNORMAL LOW (ref 13.0–17.0)

## 2019-06-18 LAB — PHOSPHORUS: Phosphorus: 8 mg/dL — ABNORMAL HIGH (ref 2.5–4.6)

## 2019-06-18 LAB — MAGNESIUM: Magnesium: 1.6 mg/dL — ABNORMAL LOW (ref 1.7–2.4)

## 2019-06-18 LAB — MRSA PCR SCREENING: MRSA by PCR: NEGATIVE

## 2019-06-18 LAB — ABO/RH: ABO/RH(D): A POS

## 2019-06-18 SURGERY — EXPLORATION, MEDIASTINUM
Anesthesia: General | Site: Chest | Laterality: Right

## 2019-06-18 MED ORDER — FENTANYL BOLUS VIA INFUSION
50.0000 ug | INTRAVENOUS | Status: DC | PRN
  Administered 2019-06-18: 23:00:00 50 ug via INTRAVENOUS
  Filled 2019-06-18: qty 50

## 2019-06-18 MED ORDER — SODIUM CHLORIDE 0.9 % IV SOLN
1.5000 g | INTRAVENOUS | Status: DC
  Filled 2019-06-18: qty 1.5

## 2019-06-18 MED ORDER — CALCIUM CHLORIDE 10 % IV SOLN
INTRAVENOUS | Status: DC | PRN
  Administered 2019-06-18 (×11): 1 g via INTRAVENOUS
  Administered 2019-06-18: .5 g via INTRAVENOUS
  Administered 2019-06-18: 1 g via INTRAVENOUS

## 2019-06-18 MED ORDER — SUCCINYLCHOLINE CHLORIDE 20 MG/ML IJ SOLN
INTRAMUSCULAR | Status: AC | PRN
  Administered 2019-06-18: 100 mg via INTRAVENOUS

## 2019-06-18 MED ORDER — PROPOFOL 10 MG/ML IV BOLUS
INTRAVENOUS | Status: AC
  Filled 2019-06-18: qty 20

## 2019-06-18 MED ORDER — FENTANYL CITRATE (PF) 250 MCG/5ML IJ SOLN
INTRAMUSCULAR | Status: AC
  Filled 2019-06-18: qty 5

## 2019-06-18 MED ORDER — VANCOMYCIN HCL 1250 MG/250ML IV SOLN
1250.0000 mg | INTRAVENOUS | Status: DC
  Filled 2019-06-18: qty 250

## 2019-06-18 MED ORDER — FENTANYL CITRATE (PF) 100 MCG/2ML IJ SOLN
50.0000 ug | Freq: Once | INTRAMUSCULAR | Status: AC
  Administered 2019-06-18: 50 ug via INTRAVENOUS
  Filled 2019-06-18: qty 2

## 2019-06-18 MED ORDER — MANNITOL 25 % IV SOLN
INTRAVENOUS | Status: DC | PRN
  Administered 2019-06-18: 12.5 g via INTRAVENOUS

## 2019-06-18 MED ORDER — CHLORHEXIDINE GLUCONATE CLOTH 2 % EX PADS
6.0000 | MEDICATED_PAD | Freq: Every day | CUTANEOUS | Status: DC
  Administered 2019-06-18 – 2019-06-22 (×3): 6 via TOPICAL

## 2019-06-18 MED ORDER — NITROGLYCERIN IN D5W 200-5 MCG/ML-% IV SOLN
2.0000 ug/min | INTRAVENOUS | Status: DC
  Filled 2019-06-18: qty 250

## 2019-06-18 MED ORDER — CEFAZOLIN SODIUM-DEXTROSE 2-3 GM-%(50ML) IV SOLR
INTRAVENOUS | Status: DC | PRN
  Administered 2019-06-18: 2 g via INTRAVENOUS

## 2019-06-18 MED ORDER — CHLORHEXIDINE GLUCONATE 0.12% ORAL RINSE (MEDLINE KIT)
15.0000 mL | Freq: Two times a day (BID) | OROMUCOSAL | Status: DC
  Administered 2019-06-18 – 2019-06-21 (×8): 15 mL via OROMUCOSAL

## 2019-06-18 MED ORDER — TRANEXAMIC ACID (OHS) BOLUS VIA INFUSION
15.0000 mg/kg | INTRAVENOUS | Status: AC
  Administered 2019-06-18: 700 mg via INTRAVENOUS
  Filled 2019-06-18: qty 1050

## 2019-06-18 MED ORDER — CALCIUM CHLORIDE 10 % IV SOLN
INTRAVENOUS | Status: AC
  Filled 2019-06-18: qty 10

## 2019-06-18 MED ORDER — EPINEPHRINE HCL 5 MG/250ML IV SOLN IN NS
0.0000 ug/min | INTRAVENOUS | Status: DC
  Filled 2019-06-18: qty 250

## 2019-06-18 MED ORDER — PANTOPRAZOLE SODIUM 40 MG IV SOLR
40.0000 mg | INTRAVENOUS | Status: DC
  Administered 2019-06-19 – 2019-06-20 (×2): 40 mg via INTRAVENOUS
  Filled 2019-06-18 (×2): qty 40

## 2019-06-18 MED ORDER — CEFAZOLIN SODIUM 1 G IJ SOLR
INTRAMUSCULAR | Status: AC
  Filled 2019-06-18: qty 20

## 2019-06-18 MED ORDER — FENTANYL 2500MCG IN NS 250ML (10MCG/ML) PREMIX INFUSION
50.0000 ug/h | INTRAVENOUS | Status: DC
  Administered 2019-06-18 – 2019-06-19 (×2): 100 ug/h via INTRAVENOUS
  Administered 2019-06-20: 02:00:00 200 ug/h via INTRAVENOUS
  Filled 2019-06-18 (×3): qty 250

## 2019-06-18 MED ORDER — ALBUTEROL SULFATE HFA 108 (90 BASE) MCG/ACT IN AERS
INHALATION_SPRAY | RESPIRATORY_TRACT | Status: DC | PRN
  Administered 2019-06-18 (×3): 8 via RESPIRATORY_TRACT

## 2019-06-18 MED ORDER — MIDAZOLAM HCL (PF) 10 MG/2ML IJ SOLN
INTRAMUSCULAR | Status: AC
  Filled 2019-06-18: qty 2

## 2019-06-18 MED ORDER — VITAL HIGH PROTEIN PO LIQD: 1000.0000 mL | ORAL | Status: DC

## 2019-06-18 MED ORDER — PHENYLEPHRINE HCL-NACL 20-0.9 MG/250ML-% IV SOLN
30.0000 ug/min | INTRAVENOUS | Status: DC
  Filled 2019-06-18: qty 250

## 2019-06-18 MED ORDER — DEXMEDETOMIDINE HCL IN NACL 400 MCG/100ML IV SOLN
0.1000 ug/kg/h | INTRAVENOUS | Status: DC
  Filled 2019-06-18: qty 100

## 2019-06-18 MED ORDER — SODIUM CHLORIDE 0.9 % IV SOLN: INTRAVENOUS | Status: DC | PRN

## 2019-06-18 MED ORDER — DEXTROSE 50 % IV SOLN: 50.0000 mL | Freq: Once | INTRAVENOUS | Status: DC

## 2019-06-18 MED ORDER — TRANEXAMIC ACID (OHS) PUMP PRIME SOLUTION
2.0000 mg/kg | INTRAVENOUS | Status: DC
  Filled 2019-06-18: qty 1.4

## 2019-06-18 MED ORDER — EPINEPHRINE 1 MG/10ML IJ SOSY
PREFILLED_SYRINGE | INTRAMUSCULAR | Status: DC | PRN
  Administered 2019-06-18 (×2): .5 mg via INTRAVENOUS
  Administered 2019-06-18 (×3): 1 mg via INTRAVENOUS

## 2019-06-18 MED ORDER — INSULIN REGULAR(HUMAN) IN NACL 100-0.9 UT/100ML-% IV SOLN
INTRAVENOUS | Status: DC | PRN
  Administered 2019-06-18: 1 [IU]/h via INTRAVENOUS

## 2019-06-18 MED ORDER — COAGULATION FACTOR VIIA RECOMB 1 MG IV SOLR
90.0000 ug/kg | INTRAVENOUS | Status: AC
  Administered 2019-06-18: 6000 ug via INTRAVENOUS
  Filled 2019-06-18: qty 5

## 2019-06-18 MED ORDER — ROCURONIUM 10MG/ML (10ML) SYRINGE FOR MEDFUSION PUMP - OPTIME
INTRAVENOUS | Status: DC | PRN
  Administered 2019-06-18: 50 mg via INTRAVENOUS
  Administered 2019-06-18: 100 mg via INTRAVENOUS
  Administered 2019-06-18: 50 mg via INTRAVENOUS
  Administered 2019-06-18: 100 mg via INTRAVENOUS
  Administered 2019-06-18 (×2): 50 mg via INTRAVENOUS
  Administered 2019-06-18 (×2): 100 mg via INTRAVENOUS

## 2019-06-18 MED ORDER — ALBUTEROL SULFATE HFA 108 (90 BASE) MCG/ACT IN AERS: INHALATION_SPRAY | RESPIRATORY_TRACT | Status: DC | PRN

## 2019-06-18 MED ORDER — ORAL CARE MOUTH RINSE
15.0000 mL | OROMUCOSAL | Status: DC
  Administered 2019-06-18 – 2019-06-22 (×37): 15 mL via OROMUCOSAL

## 2019-06-18 MED ORDER — CHLORHEXIDINE GLUCONATE CLOTH 2 % EX PADS
6.0000 | MEDICATED_PAD | Freq: Every day | CUTANEOUS | Status: DC
  Administered 2019-06-19 – 2019-06-23 (×4): 6 via TOPICAL

## 2019-06-18 MED ORDER — DEXTROSE 50 % IV SOLN
INTRAVENOUS | Status: AC
  Filled 2019-06-18: qty 50

## 2019-06-18 MED ORDER — NOREPINEPHRINE 4 MG/250ML-% IV SOLN
INTRAVENOUS | Status: AC
  Filled 2019-06-18: qty 250

## 2019-06-18 MED ORDER — SODIUM CHLORIDE 0.9 % IV SOLN
750.0000 mg | INTRAVENOUS | Status: DC
  Filled 2019-06-18: qty 750

## 2019-06-18 MED ORDER — LACTATED RINGERS IV BOLUS
1000.0000 mL | Freq: Once | INTRAVENOUS | Status: AC
  Administered 2019-06-18: 16:00:00 1000 mL via INTRAVENOUS

## 2019-06-18 MED ORDER — VANCOMYCIN HCL 1000 MG IV SOLR
INTRAVENOUS | Status: DC | PRN
  Administered 2019-06-18: 1250 mg via INTRAVENOUS

## 2019-06-18 MED ORDER — LACTATED RINGERS IV SOLN: INTRAVENOUS | Status: DC | PRN

## 2019-06-18 MED ORDER — NOREPINEPHRINE 4 MG/250ML-% IV SOLN: 0.0000 ug/min | INTRAVENOUS | Status: DC

## 2019-06-18 MED ORDER — MIDAZOLAM HCL 2 MG/2ML IJ SOLN
INTRAMUSCULAR | Status: DC | PRN
  Administered 2019-06-18: 2 mg via INTRAVENOUS
  Administered 2019-06-18: 10 mg via INTRAVENOUS

## 2019-06-18 MED ORDER — PAPAVERINE HCL 30 MG/ML IJ SOLN
INTRAMUSCULAR | Status: AC
  Filled 2019-06-18: qty 2

## 2019-06-18 MED ORDER — ROCURONIUM BROMIDE 10 MG/ML (PF) SYRINGE
PREFILLED_SYRINGE | INTRAVENOUS | Status: AC
  Filled 2019-06-18: qty 30

## 2019-06-18 MED ORDER — TRANEXAMIC ACID 1000 MG/10ML IV SOLN
1.5000 mg/kg/h | INTRAVENOUS | Status: AC
  Administered 2019-06-18: 1 mg/kg/h via INTRAVENOUS
  Filled 2019-06-18: qty 25

## 2019-06-18 MED ORDER — DOPAMINE-DEXTROSE 3.2-5 MG/ML-% IV SOLN
0.0000 ug/kg/min | INTRAVENOUS | Status: DC
  Filled 2019-06-18: qty 250

## 2019-06-18 MED ORDER — MAGNESIUM SULFATE 50 % IJ SOLN
40.0000 meq | INTRAMUSCULAR | Status: DC
  Filled 2019-06-18: qty 9.85

## 2019-06-18 MED ORDER — SODIUM BICARBONATE 8.4 % IV SOLN
INTRAVENOUS | Status: AC
  Filled 2019-06-18: qty 50

## 2019-06-18 MED ORDER — EPINEPHRINE 1 MG/10ML IJ SOSY
PREFILLED_SYRINGE | INTRAMUSCULAR | Status: AC | PRN
  Administered 2019-06-18: 1 via INTRAVENOUS
  Administered 2019-06-18: 1 mg via INTRAVENOUS

## 2019-06-18 MED ORDER — DEXTROSE 50 % IV SOLN
INTRAVENOUS | Status: DC | PRN
  Administered 2019-06-18: 25 g via INTRAVENOUS

## 2019-06-18 MED ORDER — POTASSIUM CHLORIDE 2 MEQ/ML IV SOLN
80.0000 meq | INTRAVENOUS | Status: DC
  Filled 2019-06-18: qty 40

## 2019-06-18 MED ORDER — SODIUM CHLORIDE 0.9% FLUSH: 10.0000 mL | INTRAVENOUS | Status: DC | PRN

## 2019-06-18 MED ORDER — PLASMA-LYTE 148 IV SOLN
INTRAVENOUS | Status: DC
  Filled 2019-06-18: qty 2.5

## 2019-06-18 MED ORDER — INSULIN REGULAR(HUMAN) IN NACL 100-0.9 UT/100ML-% IV SOLN
INTRAVENOUS | Status: DC
  Filled 2019-06-18: qty 100

## 2019-06-18 MED ORDER — SODIUM CHLORIDE 0.9 % IV SOLN
INTRAVENOUS | Status: DC | PRN
  Administered 2019-06-18: 06:00:00 500 mL

## 2019-06-18 MED ORDER — SODIUM CHLORIDE 0.9 % IV SOLN
INTRAVENOUS | Status: AC
  Filled 2019-06-18: qty 1.2

## 2019-06-18 MED ORDER — LACTATED RINGERS IV BOLUS
1000.0000 mL | Freq: Once | INTRAVENOUS | Status: AC
  Administered 2019-06-18: 22:00:00 1000 mL via INTRAVENOUS

## 2019-06-18 MED ORDER — LACTATED RINGERS IV SOLN: INTRAVENOUS | Status: DC

## 2019-06-18 MED ORDER — CALCIUM CHLORIDE 10 % IV SOLN
INTRAVENOUS | Status: AC
  Filled 2019-06-18: qty 20

## 2019-06-18 MED ORDER — FUROSEMIDE 10 MG/ML IJ SOLN
INTRAMUSCULAR | Status: DC | PRN
  Administered 2019-06-18 (×2): 40 mg via INTRAMUSCULAR

## 2019-06-18 MED ORDER — SODIUM CHLORIDE 0.9 % IV SOLN
INTRAVENOUS | Status: DC
  Filled 2019-06-18: qty 30

## 2019-06-18 MED ORDER — SODIUM BICARBONATE 4.2 % IV SOLN
INTRAVENOUS | Status: DC | PRN
  Administered 2019-06-18: 200 meq via INTRAVENOUS
  Administered 2019-06-18 (×4): 50 meq via INTRAVENOUS

## 2019-06-18 MED ORDER — HEMOSTATIC AGENTS (NO CHARGE) OPTIME
TOPICAL | Status: DC | PRN
  Administered 2019-06-18 (×3): 1 via TOPICAL

## 2019-06-18 MED ORDER — FENTANYL CITRATE (PF) 250 MCG/5ML IJ SOLN
INTRAMUSCULAR | Status: DC | PRN
  Administered 2019-06-18: 50 ug via INTRAVENOUS
  Administered 2019-06-18: 100 ug via INTRAVENOUS
  Administered 2019-06-18: 250 ug via INTRAVENOUS
  Administered 2019-06-18: 100 ug via INTRAVENOUS
  Administered 2019-06-18: 150 ug via INTRAVENOUS
  Administered 2019-06-18: 50 ug via INTRAVENOUS
  Administered 2019-06-18: 100 ug via INTRAVENOUS
  Administered 2019-06-18 (×2): 250 ug via INTRAVENOUS
  Administered 2019-06-18 (×2): 100 ug via INTRAVENOUS

## 2019-06-18 MED ORDER — CEFAZOLIN SODIUM-DEXTROSE 1-4 GM/50ML-% IV SOLN
INTRAVENOUS | Status: DC | PRN
  Administered 2019-06-18: 2 g via INTRAVENOUS

## 2019-06-18 MED ORDER — SODIUM CHLORIDE 0.9 % IR SOLN
Status: DC | PRN
  Administered 2019-06-18: 3000 mL

## 2019-06-18 MED ORDER — SODIUM BICARBONATE 8.4 % IV SOLN
INTRAVENOUS | Status: DC | PRN
  Administered 2019-06-18 (×7): 50 meq via INTRAVENOUS

## 2019-06-18 MED ORDER — ETOMIDATE 2 MG/ML IV SOLN
INTRAVENOUS | Status: AC | PRN
  Administered 2019-06-18: 20 mg via INTRAVENOUS

## 2019-06-18 MED ORDER — NOREPINEPHRINE 4 MG/250ML-% IV SOLN
INTRAVENOUS | Status: DC | PRN
  Administered 2019-06-18: 2 ug/kg/min via INTRAVENOUS

## 2019-06-18 MED ORDER — MAGNESIUM SULFATE 2 GM/50ML IV SOLN
2.0000 g | Freq: Once | INTRAVENOUS | Status: AC
  Administered 2019-06-18: 2 g via INTRAVENOUS
  Filled 2019-06-18: qty 50

## 2019-06-18 MED ORDER — ALBUMIN HUMAN 5 % IV SOLN: INTRAVENOUS | Status: DC | PRN

## 2019-06-18 MED ORDER — ROCURONIUM BROMIDE 10 MG/ML (PF) SYRINGE
PREFILLED_SYRINGE | INTRAVENOUS | Status: AC
  Filled 2019-06-18: qty 10

## 2019-06-18 MED ORDER — MILRINONE LACTATE IN DEXTROSE 20-5 MG/100ML-% IV SOLN
0.3000 ug/kg/min | INTRAVENOUS | Status: DC
  Filled 2019-06-18: qty 100

## 2019-06-18 MED ORDER — MIDAZOLAM HCL 2 MG/2ML IJ SOLN
INTRAMUSCULAR | Status: AC
  Filled 2019-06-18: qty 2

## 2019-06-18 MED ORDER — ALBUTEROL SULFATE HFA 108 (90 BASE) MCG/ACT IN AERS
INHALATION_SPRAY | RESPIRATORY_TRACT | Status: AC
  Filled 2019-06-18: qty 6.7

## 2019-06-18 SURGICAL SUPPLY — 104 items
BAG DECANTER FOR FLEXI CONT (MISCELLANEOUS) ×12 IMPLANT
BLADE STERNUM SYSTEM 6 (BLADE) ×2 IMPLANT
BLADE SURG 10 STRL SS (BLADE) ×2 IMPLANT
BLADE SURG 11 STRL SS (BLADE) ×2 IMPLANT
BLADE SURG 15 STRL LF DISP TIS (BLADE) IMPLANT
BLADE SURG 15 STRL SS (BLADE) ×2
BNDG ELASTIC 4X5.8 VLCR STR LF (GAUZE/BANDAGES/DRESSINGS) ×2 IMPLANT
BNDG ESMARK 4X9 LF (GAUZE/BANDAGES/DRESSINGS) ×2 IMPLANT
BOOT SUTURE AID YELLOW STND (SUTURE) ×2 IMPLANT
CANISTER PREVENA 45 (CANNISTER) ×2 IMPLANT
CANISTER SUCT 3000ML PPV (MISCELLANEOUS) ×6 IMPLANT
CANNULA VESSEL 3MM 2 BLNT TIP (CANNULA) ×6 IMPLANT
CATH EMB 3FR 40CM (CATHETERS) ×2 IMPLANT
CATH THORACIC 36FR (CATHETERS) ×4 IMPLANT
CONN ST 1/4X3/8  BEN (MISCELLANEOUS) ×2
CONN ST 1/4X3/8 BEN (MISCELLANEOUS) IMPLANT
CONT SPEC 4OZ CLIKSEAL STRL BL (MISCELLANEOUS) ×4 IMPLANT
DERMABOND ADVANCED (GAUZE/BANDAGES/DRESSINGS) ×2
DERMABOND ADVANCED .7 DNX12 (GAUZE/BANDAGES/DRESSINGS) IMPLANT
DRAIN CHANNEL 15F RND FF W/TCR (WOUND CARE) ×2 IMPLANT
DRAIN CHANNEL 32F RND 10.7 FF (WOUND CARE) ×2 IMPLANT
DRAPE HALF SHEET 40X57 (DRAPES) ×2 IMPLANT
DRAPE ORTHO SPLIT 77X108 STRL (DRAPES) ×2
DRAPE SURG ORHT 6 SPLT 77X108 (DRAPES) IMPLANT
DRSG COVADERM 4X14 (GAUZE/BANDAGES/DRESSINGS) ×6 IMPLANT
DRSG VAC ATS MED SENSATRAC (GAUZE/BANDAGES/DRESSINGS) ×2 IMPLANT
ELECT BLADE 6.5 EXT (BLADE) ×2 IMPLANT
ELECT REM PT RETURN 9FT ADLT (ELECTROSURGICAL) ×12
ELECTRODE REM PT RTRN 9FT ADLT (ELECTROSURGICAL) ×8 IMPLANT
EVACUATOR SILICONE 100CC (DRAIN) ×2 IMPLANT
FELT TEFLON 1X6 (MISCELLANEOUS) ×6 IMPLANT
FELT TEFLON 6X6 (MISCELLANEOUS) IMPLANT
GAUZE SPONGE 4X4 12PLY STRL (GAUZE/BANDAGES/DRESSINGS) ×6 IMPLANT
GAUZE SPONGE 4X4 12PLY STRL LF (GAUZE/BANDAGES/DRESSINGS) ×10 IMPLANT
GLOVE BIO SURGEON STRL SZ 6 (GLOVE) ×4 IMPLANT
GLOVE BIO SURGEON STRL SZ 6.5 (GLOVE) ×3 IMPLANT
GLOVE BIO SURGEON STRL SZ7 (GLOVE) ×2 IMPLANT
GLOVE BIO SURGEONS STRL SZ 6.5 (GLOVE) ×3
GLOVE BIOGEL PI IND STRL 6.5 (GLOVE) IMPLANT
GLOVE BIOGEL PI INDICATOR 6.5 (GLOVE) ×2
GLOVE SURG SIGNA 7.5 PF LTX (GLOVE) ×12 IMPLANT
GLOVE SURG SS PI 6.5 STRL IVOR (GLOVE) ×2 IMPLANT
GOWN STRL NON-REIN LRG LVL3 (GOWN DISPOSABLE) ×2 IMPLANT
GOWN STRL REUS W/ TWL LRG LVL3 (GOWN DISPOSABLE) ×16 IMPLANT
GOWN STRL REUS W/TWL LRG LVL3 (GOWN DISPOSABLE) ×12
HANDLE STAPLE ENDO GIA SHORT (STAPLE) ×1
HEMOSTAT POWDER SURGIFOAM 1G (HEMOSTASIS) ×6 IMPLANT
INSERT FOGARTY SM (MISCELLANEOUS) ×6 IMPLANT
KIT SUCTION CATH 14FR (SUCTIONS) ×2 IMPLANT
KIT TURNOVER KIT B (KITS) ×6 IMPLANT
LOOP VESSEL MAXI BLUE (MISCELLANEOUS) ×4 IMPLANT
LOOP VESSEL MINI RED (MISCELLANEOUS) ×4 IMPLANT
NS IRRIG 1000ML POUR BTL (IV SOLUTION) ×24 IMPLANT
PACK E OPEN HEART (SUTURE) ×2 IMPLANT
PACK OPEN HEART (CUSTOM PROCEDURE TRAY) ×2 IMPLANT
PAD ARMBOARD 7.5X6 YLW CONV (MISCELLANEOUS) ×12 IMPLANT
PAD NEG PRESSURE SENSATRAC (MISCELLANEOUS) ×2 IMPLANT
PENCIL BUTTON HOLSTER BLD 10FT (ELECTRODE) ×2 IMPLANT
RELOAD EGIA 60 MED/THCK PURPLE (STAPLE) ×24 IMPLANT
RELOAD STAPLE 60 BLK XTHK ART (STAPLE) IMPLANT
RELOAD STAPLE 60 MED/THCK ART (STAPLE) IMPLANT
RELOAD TRI 2.0 60 XTHK VAS SUL (STAPLE) ×36 IMPLANT
RELOAD TRI 60 ART MED THCK BLK (STAPLE) ×4 IMPLANT
SPONGE LAP 18X18 RF (DISPOSABLE) ×24 IMPLANT
SPONGE LAP 4X18 RFD (DISPOSABLE) ×8 IMPLANT
STAPLER ENDO GIA 12 SHRT THIN (STAPLE) IMPLANT
STAPLER ENDO GIA 12MM SHORT (STAPLE) ×5 IMPLANT
STAPLER VISISTAT 35W (STAPLE) ×8 IMPLANT
SUT PROLENE 3 0 SH 48 (SUTURE) IMPLANT
SUT PROLENE 4 0 RB 1 (SUTURE) ×18
SUT PROLENE 4 0 SH DA (SUTURE) ×6 IMPLANT
SUT PROLENE 4-0 RB1 .5 CRCL 36 (SUTURE) ×8 IMPLANT
SUT PROLENE 5 0 C 1 24 (SUTURE) ×2 IMPLANT
SUT PROLENE 6 0 BV (SUTURE) ×10 IMPLANT
SUT PROLENE 6 0 C 1 30 (SUTURE) ×6 IMPLANT
SUT SILK  1 MH (SUTURE) ×2
SUT SILK 1 MH (SUTURE) IMPLANT
SUT SILK 2 0SH CR/8 30 (SUTURE) ×6 IMPLANT
SUT SILK 3 0 SH CR/8 (SUTURE) IMPLANT
SUT SILK 3 0 TIES 10X30 (SUTURE) ×2 IMPLANT
SUT STEEL 6MS V (SUTURE) ×8 IMPLANT
SUT VIC AB 0 CTXB 36 (SUTURE) IMPLANT
SUT VIC AB 1 CTX 36 (SUTURE) ×12
SUT VIC AB 1 CTX36XBRD ANBCTR (SUTURE) ×16 IMPLANT
SUT VIC AB 2 TP1 27 (SUTURE) ×24 IMPLANT
SUT VIC AB 2-0 CTX 27 (SUTURE) ×10 IMPLANT
SUT VIC AB 2-0 CTX 36 (SUTURE) ×28 IMPLANT
SUT VIC AB 3-0 SH 27 (SUTURE) ×8
SUT VIC AB 3-0 SH 27X BRD (SUTURE) IMPLANT
SUT VIC AB 3-0 X1 27 (SUTURE) ×2 IMPLANT
SUT VIC AB 4-0 PS2 18 (SUTURE) ×4 IMPLANT
SUT VICRYL 2 TP 1 (SUTURE) ×4 IMPLANT
SYR 10ML KIT SKIN ADHESIVE (MISCELLANEOUS) IMPLANT
SYR 3ML LL SCALE MARK (SYRINGE) IMPLANT
SYR TB 1ML LUER SLIP (SYRINGE) ×2 IMPLANT
SYSTEM SAHARA CHEST DRAIN ATS (WOUND CARE) ×2 IMPLANT
TAPE CLOTH SURG 4X10 WHT LF (GAUZE/BANDAGES/DRESSINGS) ×4 IMPLANT
TOWEL GREEN STERILE (TOWEL DISPOSABLE) ×6 IMPLANT
TUBE CONNECTING 12'X1/4 (SUCTIONS) ×1
TUBE CONNECTING 12X1/4 (SUCTIONS) ×1 IMPLANT
TUBE CONNECTING 20'X1/4 (TUBING) ×2
TUBE CONNECTING 20X1/4 (TUBING) ×2 IMPLANT
WATER STERILE IRR 1000ML POUR (IV SOLUTION) ×12 IMPLANT
YANKAUER SUCT BULB TIP NO VENT (SUCTIONS) ×14 IMPLANT
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
## 2019-06-18 NOTE — ED Notes (Signed)
Jovane pts sister [phone] here outside

## 2019-06-18 NOTE — ED Notes (Signed)
[phone] pts mother William Hamburger wants to be called back with update on son, mother is in [place]
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
## 2019-06-18 NOTE — Anesthesia Procedure Notes (Signed)
Arterial Line Insertion Start/End12/22/2020 5:45 AM, 06/18/2019 5:50 AM Performed by: Roberts Gaudy, MD, attending  Patient location: OR. Preanesthetic checklist: patient identified, IV checked, site marked, risks and benefits discussed, surgical consent, monitors and equipment checked, pre-op evaluation, timeout performed and anesthesia consent Right, femoral was placed Hand hygiene performed  and maximum sterile barriers used   Procedure performed without using ultrasound guided technique.

## 2019-06-18 NOTE — Progress Notes (Signed)
RT NOTE: RT transported patient from OR to 2H08. No complications.   RT placed patient on previous OR vent settings per CRNA. RT will continue to monitor.

## 2019-06-18 NOTE — Anesthesia Procedure Notes (Signed)
Central Venous Catheter Insertion Performed by: Roberts Gaudy, MD Start/End12/22/2020 5:40 AM, 06/18/2019 5:45 AM Hand hygiene performed  and maximum sterile barriers used  Catheter size: 12 Fr Central line was placed.Triple lumen Procedure performed without using ultrasound guided technique. Attempts: 1 Following insertion, line sutured, dressing applied and Biopatch. Post procedure assessment: blood return through all ports, free fluid flow and no air  Patient tolerated the procedure well with no immediate complications.
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
## 2019-06-18 NOTE — Anesthesia Preprocedure Evaluation (Signed)
Anesthesia Evaluation  Patient identified by MRN, date of birth, ID band Patient unresponsive  Preop documentation limited or incomplete due to emergent nature of procedure.  Airway Mallampati: Intubated       Dental   Pulmonary    + rhonchi        Cardiovascular  Rhythm:Regular Rate:Tachycardia     Neuro/Psych    GI/Hepatic   Endo/Other    Renal/GU      Musculoskeletal   Abdominal   Peds  Hematology   Anesthesia Other Findings   Reproductive/Obstetrics                             Anesthesia Physical Anesthesia Plan  ASA: IV and emergent  Anesthesia Plan: General   Post-op Pain Management:    Induction: Intravenous  PONV Risk Score and Plan:   Airway Management Planned: Oral ETT  Additional Equipment: Arterial line, CVP and Ultrasound Guidance Line Placement  Intra-op Plan:   Post-operative Plan: Post-operative intubation/ventilation  Informed Consent:   Plan Discussed with: CRNA and Anesthesiologist  Anesthesia Plan Comments:         Anesthesia Quick Evaluation
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
## 2019-06-18 NOTE — ED Notes (Signed)
Jovane pts sister [phone]
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
## 2019-06-18 NOTE — Consult Note (Signed)
I performed intraoperative consult with Dr. Scot Dock.   I agree with plan for volar compartment release to be performed by Dr. Scot Dock. Patient is very tenuous at this time. I agree that ulnar stabilization is not urgent and should be delayed. I see no benefit of external fixater placement as radius is stable and fracture is minimally displaced at the ulna.   Formal consult to follow.    Renette Butters

## 2019-06-18 NOTE — Code Documentation (Signed)
Thoracotomy to L chest by Dr.Tsuei during cpr; Central line placed by Dr. Roxanne Mins
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
## 2019-06-18 NOTE — Procedures (Signed)
Arterial Catheter Insertion Procedure Note Bryce Pace [id number] 10/12/1995  Procedure: Insertion of Arterial Catheter  Indications: Blood pressure monitoring  Procedure Details Consent: Unable to obtain consent because of sedated on the ventilator. Time Out: Verified patient identification, verified procedure, site/side was marked, verified correct patient position, special equipment/implants available, medications/allergies/relevent history reviewed, required imaging and test results available.  Performed  Maximum sterile technique was used including antiseptics, cap, gloves, gown, hand hygiene, mask and sheet. Skin prep: Chlorhexidine; local anesthetic administered 20 gauge catheter was inserted into left radial artery using the Seldinger technique. ULTRASOUND GUIDANCE USED: NO Evaluation Blood flow good; BP tracing good. Complications: No apparent complications.   Kathie Dike 06/18/2019
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
## 2019-06-18 NOTE — ED Notes (Signed)
No IV access at present: IO placed by EMS at 0426; Trauma MD placing femoral line

## 2019-06-18 NOTE — Progress Notes (Signed)
Patient returned from OR after lengthy procedure. Per anesthesia, approximately 70 units of product and 6.5L of cellsaver given. L IJ trialysis catheter and R femoral art line placed intra-op. Returned on levophed at 6. Vent at 100% and 12 on arrival, increased to 14. CXR, ABG, and rainbow labs, including TEG sent. Liquid BM, flexiseal placed. Patient is extremely edematous, which is completely expected given his hospital course and appears mostly resuscitated thanks to the excellent intra-operative care by anesthesia. Pupils are too small to assess reactivity well. Abdomen is soft. Dressings with minimal shadowing present. CT x3 (b/l pleural, med) on suction, minimal o/p. Wvac to L forearm fasciotomy with minimal o/p. Family update provided to sister at bedside and parents via video call. Clearly described the gravity of his condition, the expectation of a long recovery with possible deficits from cardiac arrest and tourniquet placement, as well as the high likelihood of organ dysfunction and/or failure during his hospitalization. Family was assured that he is receiving maximal medical therapy and we will continue to provide this. Mother will be traveling from [facility] 12/23. Explained to her the visitor policy.  Critical care time: 80 minutes  Jesusita Oka, MD General and [facility]
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
## 2019-06-19 ENCOUNTER — Inpatient Hospital Stay (HOSPITAL_COMMUNITY): Payer: Managed Care, Other (non HMO)

## 2019-06-19 LAB — GLUCOSE, CAPILLARY
Glucose-Capillary: 40 mg/dL — CL (ref 70–99)
Glucose-Capillary: 86 mg/dL (ref 70–99)
Glucose-Capillary: 135 mg/dL — ABNORMAL HIGH (ref 70–99)
Glucose-Capillary: 126 mg/dL — ABNORMAL HIGH (ref 70–99)
Glucose-Capillary: 137 mg/dL — ABNORMAL HIGH (ref 70–99)
Glucose-Capillary: 113 mg/dL — ABNORMAL HIGH (ref 70–99)

## 2019-06-19 LAB — BPAM FFP
Blood Product Expiration Date: 202012262359
Blood Product Expiration Date: 202012262359
Blood Product Expiration Date: 202012262359
Blood Product Expiration Date: 202012262359
Blood Product Expiration Date: 202012262359
Blood Product Expiration Date: 202012262359
Blood Product Expiration Date: 202012272359
Blood Product Expiration Date: 202012272359
Blood Product Expiration Date: 202012272359
Blood Product Expiration Date: 202012272359
Blood Product Expiration Date: 202012272359
Blood Product Expiration Date: 202012272359
Blood Product Expiration Date: 202012272359
Blood Product Expiration Date: 202012272359
Blood Product Expiration Date: 202012272359
Blood Product Expiration Date: 202012272359
Blood Product Expiration Date: 202012272359
Blood Product Expiration Date: 202012272359
Blood Product Expiration Date: 202012272359
Blood Product Expiration Date: 202012272359
Blood Product Expiration Date: 202012272359
Blood Product Expiration Date: 202012272359
Blood Product Expiration Date: 202012272359
Blood Product Expiration Date: 202012272359
Blood Product Expiration Date: 202012272359
Blood Product Expiration Date: 202012272359
Blood Product Expiration Date: 202012272359
Blood Product Expiration Date: 202012272359
Blood Product Expiration Date: 202012272359
Blood Product Expiration Date: 202101052359
Blood Product Expiration Date: 202101102359
Blood Product Expiration Date: 202101102359
ISSUE DATE / TIME: 202012220440
ISSUE DATE / TIME: 202012220440
ISSUE DATE / TIME: 202012220458
ISSUE DATE / TIME: 202012220458
ISSUE DATE / TIME: 202012220553
ISSUE DATE / TIME: 202012220553
ISSUE DATE / TIME: 202012220553
ISSUE DATE / TIME: 202012220553
ISSUE DATE / TIME: 202012220619
ISSUE DATE / TIME: 202012220619
ISSUE DATE / TIME: 202012220619
ISSUE DATE / TIME: 202012220619
ISSUE DATE / TIME: 202012220620
ISSUE DATE / TIME: 202012220620
ISSUE DATE / TIME: 202012220620
ISSUE DATE / TIME: 202012220642
ISSUE DATE / TIME: 202012220642
ISSUE DATE / TIME: 202012220649
ISSUE DATE / TIME: 202012220649
ISSUE DATE / TIME: 202012220700
ISSUE DATE / TIME: 202012220710
ISSUE DATE / TIME: 202012220724
ISSUE DATE / TIME: 202012220739
ISSUE DATE / TIME: 202012220739
ISSUE DATE / TIME: 202012220838
ISSUE DATE / TIME: 202012220838
ISSUE DATE / TIME: 202012220904
ISSUE DATE / TIME: 202012220904
ISSUE DATE / TIME: 202012220904
ISSUE DATE / TIME: 202012220904
ISSUE DATE / TIME: 202012221002
ISSUE DATE / TIME: 202012221002
Unit Type and Rh: 600
Unit Type and Rh: 600
Unit Type and Rh: 600
Unit Type and Rh: 600
Unit Type and Rh: 600
Unit Type and Rh: 600
Unit Type and Rh: 6200
Unit Type and Rh: 6200
Unit Type and Rh: 6200
Unit Type and Rh: 6200
Unit Type and Rh: 6200
Unit Type and Rh: 6200
Unit Type and Rh: 6200
Unit Type and Rh: 6200
Unit Type and Rh: 6200
Unit Type and Rh: 6200
Unit Type and Rh: 6200
Unit Type and Rh: 6200
Unit Type and Rh: 6200
Unit Type and Rh: 6200
Unit Type and Rh: 6200
Unit Type and Rh: 6200
Unit Type and Rh: 6200
Unit Type and Rh: 6200
Unit Type and Rh: 6200
Unit Type and Rh: 6200
Unit Type and Rh: 6200
Unit Type and Rh: 6200
Unit Type and Rh: 6200
Unit Type and Rh: 6200
Unit Type and Rh: 6200
Unit Type and Rh: 6200

## 2019-06-19 LAB — BPAM RBC
Blood Product Expiration Date: 202012252359
Blood Product Expiration Date: 202012272359
Blood Product Expiration Date: 202101152359
Blood Product Expiration Date: 202101152359
Blood Product Expiration Date: 202101162359
Blood Product Expiration Date: 202101162359
Blood Product Expiration Date: 202101162359
Blood Product Expiration Date: 202101172359
Blood Product Expiration Date: 202101172359
Blood Product Expiration Date: 202101172359
Blood Product Expiration Date: 202101192359
Blood Product Expiration Date: 202101202359
Blood Product Expiration Date: 202101202359
Blood Product Expiration Date: 202101202359
Blood Product Expiration Date: 202101202359
Blood Product Expiration Date: 202101202359
Blood Product Expiration Date: 202101202359
Blood Product Expiration Date: 202101222359
Blood Product Expiration Date: 202101222359
Blood Product Expiration Date: 202101222359
Blood Product Expiration Date: 202101222359
Blood Product Expiration Date: 202101222359
Blood Product Expiration Date: 202101222359
Blood Product Expiration Date: 202101222359
Blood Product Expiration Date: 202101222359
Blood Product Expiration Date: 202101222359
Blood Product Expiration Date: 202101222359
Blood Product Expiration Date: 202101222359
Blood Product Expiration Date: 202101232359
Blood Product Expiration Date: 202101232359
Blood Product Expiration Date: 202101232359
Blood Product Expiration Date: 202101232359
Blood Product Expiration Date: 202101232359
Blood Product Expiration Date: 202101232359
Blood Product Expiration Date: 202101232359
Blood Product Expiration Date: 202101232359
Blood Product Expiration Date: 202101232359
Blood Product Expiration Date: 202101232359
Blood Product Expiration Date: 202101232359
Blood Product Expiration Date: 202101232359
Blood Product Expiration Date: 202101232359
Blood Product Expiration Date: 202101232359
Blood Product Expiration Date: 202101232359
Blood Product Expiration Date: 202101232359
Blood Product Expiration Date: 202101232359
Blood Product Expiration Date: 202101242359
Blood Product Expiration Date: 202101242359
Blood Product Expiration Date: 202101242359
ISSUE DATE / TIME: 202012220435
ISSUE DATE / TIME: 202012220435
ISSUE DATE / TIME: 202012220440
ISSUE DATE / TIME: 202012220440
ISSUE DATE / TIME: 202012220458
ISSUE DATE / TIME: 202012220458
ISSUE DATE / TIME: 202012220511
ISSUE DATE / TIME: 202012220511
ISSUE DATE / TIME: 202012220552
ISSUE DATE / TIME: 202012220552
ISSUE DATE / TIME: 202012220552
ISSUE DATE / TIME: 202012220552
ISSUE DATE / TIME: 202012220617
ISSUE DATE / TIME: 202012220617
ISSUE DATE / TIME: 202012220617
ISSUE DATE / TIME: 202012220617
ISSUE DATE / TIME: 202012220620
ISSUE DATE / TIME: 202012220620
ISSUE DATE / TIME: 202012220620
ISSUE DATE / TIME: 202012220620
ISSUE DATE / TIME: 202012220636
ISSUE DATE / TIME: 202012220636
ISSUE DATE / TIME: 202012220636
ISSUE DATE / TIME: 202012220636
ISSUE DATE / TIME: 202012220645
ISSUE DATE / TIME: 202012220645
ISSUE DATE / TIME: 202012220645
ISSUE DATE / TIME: 202012220645
ISSUE DATE / TIME: 202012220648
ISSUE DATE / TIME: 202012220648
ISSUE DATE / TIME: 202012220648
ISSUE DATE / TIME: 202012220648
ISSUE DATE / TIME: 202012220658
ISSUE DATE / TIME: 202012220658
ISSUE DATE / TIME: 202012220658
ISSUE DATE / TIME: 202012220658
ISSUE DATE / TIME: 202012220658
ISSUE DATE / TIME: 202012220658
ISSUE DATE / TIME: 202012220658
ISSUE DATE / TIME: 202012220658
ISSUE DATE / TIME: 202012220708
ISSUE DATE / TIME: 202012220708
ISSUE DATE / TIME: 202012220708
ISSUE DATE / TIME: 202012220708
ISSUE DATE / TIME: 202012220708
ISSUE DATE / TIME: 202012220708
ISSUE DATE / TIME: 202012220708
ISSUE DATE / TIME: 202012220708
Unit Type and Rh: 5100
Unit Type and Rh: 5100
Unit Type and Rh: 5100
Unit Type and Rh: 5100
Unit Type and Rh: 5100
Unit Type and Rh: 5100
Unit Type and Rh: 5100
Unit Type and Rh: 5100
Unit Type and Rh: 6200
Unit Type and Rh: 6200
Unit Type and Rh: 6200
Unit Type and Rh: 6200
Unit Type and Rh: 6200
Unit Type and Rh: 6200
Unit Type and Rh: 6200
Unit Type and Rh: 6200
Unit Type and Rh: 6200
Unit Type and Rh: 6200
Unit Type and Rh: 6200
Unit Type and Rh: 6200
Unit Type and Rh: 6200
Unit Type and Rh: 6200
Unit Type and Rh: 6200
Unit Type and Rh: 6200
Unit Type and Rh: 6200
Unit Type and Rh: 6200
Unit Type and Rh: 6200
Unit Type and Rh: 6200
Unit Type and Rh: 6200
Unit Type and Rh: 6200
Unit Type and Rh: 6200
Unit Type and Rh: 6200
Unit Type and Rh: 6200
Unit Type and Rh: 6200
Unit Type and Rh: 6200
Unit Type and Rh: 6200
Unit Type and Rh: 6200
Unit Type and Rh: 6200
Unit Type and Rh: 6200
Unit Type and Rh: 6200
Unit Type and Rh: 6200
Unit Type and Rh: 6200
Unit Type and Rh: 6200
Unit Type and Rh: 6200
Unit Type and Rh: 6200
Unit Type and Rh: 6200
Unit Type and Rh: 6200
Unit Type and Rh: 6200

## 2019-06-19 LAB — PREPARE FRESH FROZEN PLASMA
Unit division: 0
Unit division: 0
Unit division: 0
Unit division: 0
Unit division: 0
Unit division: 0
Unit division: 0
Unit division: 0
Unit division: 0
Unit division: 0
Unit division: 0
Unit division: 0
Unit division: 0
Unit division: 0
Unit division: 0
Unit division: 0
Unit division: 0
Unit division: 0
Unit division: 0
Unit division: 0
Unit division: 0
Unit division: 0
Unit division: 0
Unit division: 0
Unit division: 0
Unit division: 0
Unit division: 0
Unit division: 0
Unit division: 0
Unit division: 0
Unit division: 0
Unit division: 0

## 2019-06-19 LAB — PREPARE PLATELET PHERESIS
Unit division: 0
Unit division: 0
Unit division: 0
Unit division: 0
Unit division: 0
Unit division: 0
Unit division: 0
Unit division: 0
Unit division: 0

## 2019-06-19 LAB — TYPE AND SCREEN
ABO/RH(D): A POS
Antibody Screen: NEGATIVE
Unit division: 0
Unit division: 0
Unit division: 0
Unit division: 0
Unit division: 0
Unit division: 0
Unit division: 0
Unit division: 0
Unit division: 0
Unit division: 0
Unit division: 0
Unit division: 0
Unit division: 0
Unit division: 0
Unit division: 0
Unit division: 0
Unit division: 0
Unit division: 0
Unit division: 0
Unit division: 0
Unit division: 0
Unit division: 0
Unit division: 0
Unit division: 0
Unit division: 0
Unit division: 0
Unit division: 0
Unit division: 0
Unit division: 0
Unit division: 0
Unit division: 0
Unit division: 0
Unit division: 0
Unit division: 0
Unit division: 0
Unit division: 0
Unit division: 0
Unit division: 0
Unit division: 0
Unit division: 0
Unit division: 0
Unit division: 0
Unit division: 0
Unit division: 0
Unit division: 0
Unit division: 0
Unit division: 0
Unit division: 0

## 2019-06-19 LAB — PREPARE CRYOPRECIPITATE
Unit division: 0
Unit division: 0
Unit division: 0
Unit division: 0
Unit division: 0
Unit division: 0
Unit division: 0
Unit division: 0

## 2019-06-19 LAB — BPAM CRYOPRECIPITATE
Blood Product Expiration Date: 202012221159
Blood Product Expiration Date: 202012221235
Blood Product Expiration Date: 202012221235
Blood Product Expiration Date: 202012221235
Blood Product Expiration Date: 202012221436
Blood Product Expiration Date: 202012221436
Blood Product Expiration Date: 202012221651
Blood Product Expiration Date: 202012221651
ISSUE DATE / TIME: 202012220634
ISSUE DATE / TIME: 202012220659
ISSUE DATE / TIME: 202012220659
ISSUE DATE / TIME: 202012220704
ISSUE DATE / TIME: 202012220914
ISSUE DATE / TIME: 202012220914
ISSUE DATE / TIME: 202012221114
ISSUE DATE / TIME: 202012221114
Unit Type and Rh: 6200
Unit Type and Rh: 6200
Unit Type and Rh: 6200
Unit Type and Rh: 6200
Unit Type and Rh: 5100
Unit Type and Rh: 5100
Unit Type and Rh: 5100
Unit Type and Rh: 6200

## 2019-06-19 LAB — BPAM PLATELET PHERESIS
Blood Product Expiration Date: 202012232359
Blood Product Expiration Date: 202012242359
Blood Product Expiration Date: 202012242359
Blood Product Expiration Date: 202012230850
Blood Product Expiration Date: 202012242359
Blood Product Expiration Date: 202012252359
Blood Product Expiration Date: 202012252359
Blood Product Expiration Date: 202012242359
Blood Product Expiration Date: 202012252359
ISSUE DATE / TIME: 202012220508
ISSUE DATE / TIME: 202012220624
ISSUE DATE / TIME: 202012220654
ISSUE DATE / TIME: 202012220900
ISSUE DATE / TIME: 202012220900
ISSUE DATE / TIME: 202012221053
ISSUE DATE / TIME: 202012221053
ISSUE DATE / TIME: 202012221255
ISSUE DATE / TIME: 202012221255
Unit Type and Rh: 2800
Unit Type and Rh: 600
Unit Type and Rh: 6200
Unit Type and Rh: 6200
Unit Type and Rh: 6200
Unit Type and Rh: 6200
Unit Type and Rh: 7300
Unit Type and Rh: 5100
Unit Type and Rh: 5100

## 2019-06-19 LAB — COMPREHENSIVE METABOLIC PANEL
ALT: 127 U/L — ABNORMAL HIGH (ref 0–44)
AST: 333 U/L — ABNORMAL HIGH (ref 15–41)
Albumin: 2.8 g/dL — ABNORMAL LOW (ref 3.5–5.0)
Alkaline Phosphatase: 42 U/L (ref 38–126)
Anion gap: 14 (ref 5–15)
BUN: 22 mg/dL — ABNORMAL HIGH (ref 6–20)
CO2: 31 mmol/L (ref 22–32)
Calcium: 7.9 mg/dL — ABNORMAL LOW (ref 8.9–10.3)
Chloride: 108 mmol/L (ref 98–111)
Creatinine, Ser: 2.22 mg/dL — ABNORMAL HIGH (ref 0.61–1.24)
GFR calc Af Amer: 47 mL/min — ABNORMAL LOW (ref >60)
GFR calc non Af Amer: 41 mL/min — ABNORMAL LOW (ref >60)
Glucose, Bld: 121 mg/dL — ABNORMAL HIGH (ref 70–99)
Potassium: 4.2 mmol/L (ref 3.5–5.1)
Sodium: 153 mmol/L — ABNORMAL HIGH (ref 135–145)
Total Bilirubin: 1.5 mg/dL — ABNORMAL HIGH (ref 0.3–1.2)
Total Protein: 4.7 g/dL — ABNORMAL LOW (ref 6.5–8.1)

## 2019-06-19 LAB — CBC
HCT: 35.9 % — ABNORMAL LOW (ref 39.0–52.0)
HCT: 35.3 % — ABNORMAL LOW (ref 39.0–52.0)
HCT: 35.8 % — ABNORMAL LOW (ref 39.0–52.0)
Hemoglobin: 13.2 g/dL (ref 13.0–17.0)
Hemoglobin: 12.6 g/dL — ABNORMAL LOW (ref 13.0–17.0)
Hemoglobin: 12.3 g/dL — ABNORMAL LOW (ref 13.0–17.0)
MCH: 30.6 pg (ref 26.0–34.0)
MCH: 30.6 pg (ref 26.0–34.0)
MCH: 30.3 pg (ref 26.0–34.0)
MCHC: 36.8 g/dL — ABNORMAL HIGH (ref 30.0–36.0)
MCHC: 35.7 g/dL (ref 30.0–36.0)
MCHC: 34.4 g/dL (ref 30.0–36.0)
MCV: 83.1 fL (ref 80.0–100.0)
MCV: 85.7 fL (ref 80.0–100.0)
MCV: 88.2 fL (ref 80.0–100.0)
Platelets: 176 10*3/uL (ref 150–400)
Platelets: 163 10*3/uL (ref 150–400)
Platelets: 150 10*3/uL (ref 150–400)
RBC: 4.32 MIL/uL (ref 4.22–5.81)
RBC: 4.12 MIL/uL — ABNORMAL LOW (ref 4.22–5.81)
RBC: 4.06 MIL/uL — ABNORMAL LOW (ref 4.22–5.81)
RDW: 13.7 % (ref 11.5–15.5)
RDW: 14.3 % (ref 11.5–15.5)
RDW: 14.9 % (ref 11.5–15.5)
WBC: 4.4 10*3/uL (ref 4.0–10.5)
WBC: 5.8 10*3/uL (ref 4.0–10.5)
WBC: 10.9 10*3/uL — ABNORMAL HIGH (ref 4.0–10.5)
nRBC: 0 % (ref 0.0–0.2)
nRBC: 0 % (ref 0.0–0.2)
nRBC: 0 % (ref 0.0–0.2)

## 2019-06-19 LAB — POCT I-STAT 7, (LYTES, BLD GAS, ICA,H+H)
Acid-Base Excess: 9 mmol/L — ABNORMAL HIGH (ref 0.0–2.0)
Acid-Base Excess: 11 mmol/L — ABNORMAL HIGH (ref 0.0–2.0)
Bicarbonate: 33.8 mmol/L — ABNORMAL HIGH (ref 20.0–28.0)
Bicarbonate: 34.4 mmol/L — ABNORMAL HIGH (ref 20.0–28.0)
Calcium, Ion: 1.14 mmol/L — ABNORMAL LOW (ref 1.15–1.40)
Calcium, Ion: 1.07 mmol/L — ABNORMAL LOW (ref 1.15–1.40)
HCT: 33 % — ABNORMAL LOW (ref 39.0–52.0)
HCT: 30 % — ABNORMAL LOW (ref 39.0–52.0)
Hemoglobin: 11.2 g/dL — ABNORMAL LOW (ref 13.0–17.0)
Hemoglobin: 10.2 g/dL — ABNORMAL LOW (ref 13.0–17.0)
O2 Saturation: 99 %
O2 Saturation: 99 %
Patient temperature: 100
Potassium: 4.2 mmol/L (ref 3.5–5.1)
Potassium: 4.1 mmol/L (ref 3.5–5.1)
Sodium: 151 mmol/L — ABNORMAL HIGH (ref 135–145)
Sodium: 151 mmol/L — ABNORMAL HIGH (ref 135–145)
TCO2: 35 mmol/L — ABNORMAL HIGH (ref 22–32)
TCO2: 36 mmol/L — ABNORMAL HIGH (ref 22–32)
pCO2 arterial: 47.5 mmHg (ref 32.0–48.0)
pCO2 arterial: 41.9 mmHg (ref 32.0–48.0)
pH, Arterial: 7.464 — ABNORMAL HIGH (ref 7.350–7.450)
pH, Arterial: 7.522 — ABNORMAL HIGH (ref 7.350–7.450)
pO2, Arterial: 147 mmHg — ABNORMAL HIGH (ref 83.0–108.0)
pO2, Arterial: 147 mmHg — ABNORMAL HIGH (ref 83.0–108.0)

## 2019-06-19 LAB — BLOOD PRODUCT ORDER (VERBAL) VERIFICATION

## 2019-06-19 LAB — LACTIC ACID, PLASMA
Lactic Acid, Venous: 3 mmol/L (ref 0.5–1.9)
Lactic Acid, Venous: 2.3 mmol/L (ref 0.5–1.9)

## 2019-06-19 LAB — MAGNESIUM
Magnesium: 1.5 mg/dL — ABNORMAL LOW (ref 1.7–2.4)
Magnesium: 1.5 mg/dL — ABNORMAL LOW (ref 1.7–2.4)
Magnesium: 2.4 mg/dL (ref 1.7–2.4)

## 2019-06-19 LAB — PHOSPHORUS
Phosphorus: 6.9 mg/dL — ABNORMAL HIGH (ref 2.5–4.6)
Phosphorus: 7.6 mg/dL — ABNORMAL HIGH (ref 2.5–4.6)
Phosphorus: 7 mg/dL — ABNORMAL HIGH (ref 2.5–4.6)

## 2019-06-19 LAB — CALCIUM, IONIZED: Calcium, Ionized, Serum: 5.8 mg/dL — ABNORMAL HIGH (ref 4.5–5.6)

## 2019-06-19 MED ORDER — LACTATED RINGERS IV BOLUS: 1000.0000 mL | Freq: Once | INTRAVENOUS | Status: DC

## 2019-06-19 MED ORDER — DEXTROSE IN LACTATED RINGERS 5 % IV SOLN
INTRAVENOUS | Status: DC
  Administered 2019-06-19: 12:00:00 500 mL via INTRAVENOUS

## 2019-06-19 MED ORDER — VITAL HIGH PROTEIN PO LIQD: 1000.0000 mL | ORAL | Status: DC

## 2019-06-19 MED ORDER — PRO-STAT SUGAR FREE PO LIQD: 30.0000 mL | Freq: Two times a day (BID) | ORAL | Status: DC

## 2019-06-19 MED ORDER — PIVOT 1.5 CAL PO LIQD
1000.0000 mL | ORAL | Status: DC
  Administered 2019-06-19: 11:00:00 1000 mL

## 2019-06-19 MED ORDER — MAGNESIUM SULFATE 4 GM/100ML IV SOLN
4.0000 g | Freq: Once | INTRAVENOUS | Status: AC
  Administered 2019-06-19: 4 g via INTRAVENOUS
  Filled 2019-06-19: qty 100

## 2019-06-19 MED ORDER — MIDAZOLAM HCL 2 MG/2ML IJ SOLN
2.0000 mg | INTRAMUSCULAR | Status: DC | PRN
  Administered 2019-06-19 – 2019-06-20 (×5): 2 mg via INTRAVENOUS
  Filled 2019-06-19 (×5): qty 2

## 2019-06-19 MED FILL — Magnesium Sulfate Inj 50%: INTRAMUSCULAR | Qty: 10 | Status: AC

## 2019-06-19 MED FILL — Sodium Bicarbonate IV Soln 8.4%: INTRAVENOUS | Qty: 50 | Status: AC

## 2019-06-19 MED FILL — Dexmedetomidine HCl in NaCl 0.9% IV Soln 400 MCG/100ML: INTRAVENOUS | Qty: 100 | Status: AC

## 2019-06-19 MED FILL — Heparin Sodium (Porcine) Inj 1000 Unit/ML: INTRAMUSCULAR | Qty: 2500 | Status: AC

## 2019-06-19 MED FILL — Potassium Chloride Inj 2 mEq/ML: INTRAVENOUS | Qty: 40 | Status: AC

## 2019-06-19 MED FILL — Sodium Bicarbonate IV Soln 8.4%: INTRAVENOUS | Qty: 200 | Status: AC

## 2019-06-19 MED FILL — Heparin Sodium (Porcine) Inj 1000 Unit/ML: INTRAMUSCULAR | Qty: 30 | Status: AC

## 2019-06-19 NOTE — Consult Note (Signed)
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 the Act, 21 U.S.C. section 360bbb-3(b)(1), unless the authorization is terminated or revoked sooner.    Influenza A by PCR NEGATIVE NEGATIVE   Influenza B by PCR NEGATIVE NEGATIVE    Comment: (NOTE) The Xpert Xpress SARS-CoV-2/FLU/RSV assay is intended as an aid in  the diagnosis of influenza from Nasopharyngeal swab specimens and  should not be used as a sole basis for treatment. Nasal washings and  aspirates are unacceptable for Xpert Xpress  SARS-CoV-2/FLU/RSV  testing. Fact Sheet for Patients: https://www.moore.com/ Fact Sheet for Healthcare Providers: https://www.young.biz/ This test is not yet approved or cleared by the [place] FDA and  has been authorized for detection and/or diagnosis of SARS-CoV-2 by  FDA under an Emergency Use Authorization (EUA). This EUA will remain  in effect (meaning this test can be used) for the duration of the  Covid-19 declaration under Section 564(b)(1) of the Act, 21  U.S.C. section 360bbb-3(b)(1), unless the authorization is  terminated or revoked. Performed at [facility], [address] [place], [place] [address]   I-STAT 7, (LYTES, BLD GAS, ICA, H+H)     Status: Abnormal   Collection Time: 06/18/19  7:14 AM  Result Value Ref Range   pH, Arterial 7.030 (LL) 7.350 - 7.450   pCO2 arterial 64.1 (H) 32.0 - 48.0 mmHg   pO2, Arterial 140.0 (H) 83.0 - 108.0 mmHg   Bicarbonate 17.0 (L) 20.0 - 28.0 mmol/L   TCO2 19 (L) 22 - 32 mmol/L   O2 Saturation 97.0 %   Acid-base deficit 14.0 (H) 0.0 - 2.0 mmol/L   Sodium 142 135 - 145 mmol/L   Potassium 6.4 (HH) 3.5 - 5.1 mmol/L   Calcium, Ion 0.85 (LL) 1.15 - 1.40 mmol/L   HCT 35.0 (L) 39.0 - 52.0 %   Hemoglobin 11.9 (L) 13.0 - 17.0 g/dL   Patient temperature HIDE    Collection site RADIAL, ALLEN'S TEST ACCEPTABLE    Drawn by Nurse    Sample type ARTERIAL   I-STAT 7, (LYTES, BLD GAS, ICA, H+H)     Status: Abnormal   Collection Time: 06/18/19  7:40 AM  Result Value Ref Range   pH, Arterial 7.167 (LL) 7.350 - 7.450   pCO2 arterial 71.2 (HH) 32.0 - 48.0 mmHg   pO2, Arterial 90.0 83.0 - 108.0 mmHg   Bicarbonate 25.8 20.0 - 28.0 mmol/L   TCO2 28 22 - 32 mmol/L   O2 Saturation 94.0 %   Acid-base deficit 4.0 (H) 0.0 - 2.0 mmol/L   Sodium 144 135 - 145 mmol/L   Potassium 7.0 (HH) 3.5 - 5.1 mmol/L   Calcium, Ion 0.86 (LL) 1.15 - 1.40 mmol/L   HCT 39.0 39.0 - 52.0 %   Hemoglobin 13.3 13.0 - 17.0  g/dL   Patient temperature HIDE    Collection site RADIAL, ALLEN'S TEST ACCEPTABLE    Drawn by Nurse    Sample type ARTERIAL   CBC     Status: Abnormal   Collection Time: 06/18/19  7:57 AM  Result Value Ref Range   WBC 2.5 (L) 4.0 - 10.5 K/uL   RBC 4.85 4.22 - 5.81 MIL/uL   Hemoglobin 14.4 13.0 - 17.0 g/dL    Comment: REPEATED TO VERIFY POST TRANSFUSION SPECIMEN    HCT 43.7 39.0 - 52.0 %   MCV 90.1 80.0 - 100.0 fL    Comment: REPEATED TO VERIFY DELTA CHECK NOTED    MCH 29.7 26.0 - 34.0 pg   MCHC 33.0 30.0 - 36.0 g/dL   RDW 08.6 57.8 - 46.9 %   Platelets 61 (L) 150 - 400 K/uL    Comment: REPEATED TO VERIFY Immature Platelet Fraction may be clinically indicated, consider ordering this additional test [id number] CONSISTENT WITH PREVIOUS RESULT    nRBC 0.0 0.0 - 0.2 %    Comment: Performed at [facility], [address] [place], [place] [address]  Basic metabolic panel     Status: Abnormal   Collection Time: 06/18/19  7:57 AM  Result Value Ref Range   Sodium 150 (H) 135 - 145 mmol/L   Potassium 7.1 (HH) 3.5 - 5.1 mmol/L    Comment: CRITICAL RESULT CALLED TO, READ BACK BY AND VERIFIED WITH: Arnetha Courser RN [phone] BY A BENNETT    Chloride 99 98 - 111 mmol/L   CO2 23 22 - 32 mmol/L   Glucose, Bld 383 (H) 70 - 99 mg/dL   BUN 11 6 - 20 mg/dL   Creatinine, Ser 3.47 (H) 0.61 - 1.24  mg/dL   Calcium 14.3 (HH) 8.9 - 10.3 mg/dL    Comment: CRITICAL RESULT CALLED TO, READ BACK BY AND VERIFIED WITH: Lysle Rubens RN [phone] BY A BENNETT    GFR calc non Af Amer >60 >60 mL/min   GFR calc Af Amer >60 >60 mL/min   Anion gap 28 (H) 5 - 15    Comment: Performed at [facility] [address], [place], [place] [address]  DIC (disseminated intravasc coag) panel     Status: Abnormal   Collection Time: 06/18/19  7:57 AM  Result Value Ref Range   Prothrombin Time 18.7 (H) 11.4 - 15.2 seconds   INR 1.6 (H) 0.8 - 1.2    Comment: (NOTE) INR goal varies based on device and  disease states.    aPTT 115 (H) 24 - 36 seconds    Comment:        IF BASELINE aPTT IS ELEVATED, SUGGEST PATIENT RISK ASSESSMENT BE USED TO DETERMINE APPROPRIATE ANTICOAGULANT THERAPY.    Fibrinogen 306 210 - 475 mg/dL   D-Dimer, Quant 14.96 (H) 0.00 - 0.50 ug/mL-FEU    Comment: (NOTE) At the manufacturer cut-off of 0.50 ug/mL FEU, this assay has been documented to exclude PE with a sensitivity and negative predictive value of 97 to 99%.  At this time, this assay has not been approved by the FDA to exclude DVT/VTE. Results should be correlated with clinical presentation.    Platelets 63 (L) 150 - 400 K/uL    Comment: REPEATED TO VERIFY Immature Platelet Fraction may be clinically indicated, consider ordering this additional test [id number] CONSISTENT WITH PREVIOUS RESULT    Smear Review NO SCHISTOCYTES SEEN     Comment: Performed at [facility], [place] [address], [place], [place] [address]  I-STAT 7, (LYTES, BLD GAS, ICA, H+H)     Status: Abnormal   Collection Time: 06/18/19  8:18 AM  Result Value Ref Range   pH, Arterial 7.255 (L) 7.350 - 7.450   pCO2 arterial 67.5 (HH) 32.0 - 48.0 mmHg   pO2, Arterial 56.0 (L) 83.0 - 108.0 mmHg   Bicarbonate 30.0 (H) 20.0 - 28.0 mmol/L   TCO2 32 22 - 32 mmol/L   O2 Saturation 82.0 %   Acid-Base Excess 1.0 0.0 - 2.0 mmol/L   Sodium 150 (H) 135 - 145 mmol/L   Potassium 3.6 3.5 - 5.1 mmol/L   Calcium, Ion 1.13 (L) 1.15 - 1.40 mmol/L   HCT 33.0 (L) 39.0 - 52.0 %   Hemoglobin 11.2 (L) 13.0 - 17.0 g/dL   Patient temperature HIDE    Sample type ARTERIAL    Comment NOTIFIED PHYSICIAN   Prepare cryoprecipitate     Status: None   Collection Time: 06/18/19  8:30 AM  Result Value Ref Range   Unit Number [id number]    Blood Component Type CRYPOOL THAW    Unit division 00    Status of Unit ISSUED,FINAL    Transfusion Status OK TO TRANSFUSE    Unit Number [id number]    Blood Component Type CRYPOOL THAW    Unit division 00     Status of Unit ISSUED,FINAL    Transfusion Status OK TO TRANSFUSE    Unit Number [id number]    Blood Component Type CRYPOOL THAW    Unit division 00    Status of Unit ISSUED,FINAL    Transfusion Status      OK TO TRANSFUSE Performed at [facility] [address],  [place], [place] [address]    Unit Number [id number]    Blood Component Type CRYPOOL THAW    Unit division 00    Status of Unit ISSUED,FINAL    Transfusion Status OK TO TRANSFUSE   Prepare fresh frozen plasma     Status: None   Collection Time: 06/18/19  8:31 AM  Result Value Ref Range   Unit Number [id number]    Blood Component Type THW PLS APHR    Unit division A0    Status of Unit ISSUED,FINAL    Transfusion Status OK TO TRANSFUSE    Unit Number [id number]    Blood Component Type THAWED PLASMA    Unit division 00    Status of Unit REL FROM [facility]    Transfusion Status OK TO TRANSFUSE    Unit Number [id number]    Blood Component Type THAWED PLASMA    Unit division 00    Status of Unit ISSUED,FINAL    Transfusion Status OK TO TRANSFUSE    Unit Number [id number]    Blood Component Type THAWED PLASMA    Unit division 00    Status of Unit REL FROM [facility]    Transfusion Status OK TO TRANSFUSE    Unit Number [id number]    Blood Component Type THAWED PLASMA    Unit division 00    Status of Unit REL FROM [facility]    Transfusion Status      OK TO TRANSFUSE Performed at [facility], [address] [place], [place] [address]    Unit Number [id number]    Blood Component Type THAWED PLASMA    Unit division 00    Status of Unit REL FROM [facility]    Transfusion Status OK TO TRANSFUSE   Prepare Pheresed Platelets     Status: None   Collection Time: 06/18/19  8:31 AM  Result Value Ref Range   Unit Number [id number]    Blood Component Type PLTP LR1 PAS    Unit division 00    Status of Unit ISSUED,FINAL    Transfusion Status      OK TO TRANSFUSE Performed at [facility], [address], [place], [place] [address]    Unit Number [id number]    Blood Component Type PLTP LR2 PAS    Unit division 00    Status of Unit ISSUED,FINAL    Transfusion Status OK TO TRANSFUSE    Unit Number [id number]    Blood Component Type PLTP LR2 PAS    Unit division 00    Status of Unit ISSUED,FINAL    Transfusion Status OK TO TRANSFUSE    Unit Number [id number]    Blood Component Type PLTPHER LR1    Unit division 00    Status of Unit ISSUED,FINAL    Transfusion Status OK TO TRANSFUSE   I-STAT 7, (LYTES, BLD GAS, ICA, H+H)     Status: Abnormal   Collection Time: 06/18/19  9:37 AM  Result Value Ref Range   pH, Arterial 7.307 (L) 7.350 - 7.450   pCO2 arterial 61.2 (H) 32.0 - 48.0 mmHg   pO2, Arterial 55.0 (L) 83.0 - 108.0 mmHg   Bicarbonate 30.6 (H) 20.0 - 28.0 mmol/L   TCO2 32 22 - 32 mmol/L   O2 Saturation 84.0 %   Acid-Base Excess 4.0 (H) 0.0 - 2.0 mmol/L   Sodium 153 (H) 135 - 145 mmol/L   Potassium 3.3 (L) 3.5 - 5.1 mmol/L   Calcium, Ion 1.02 (L) 1.15 -  1.40 mmol/L   HCT 18.0 (L) 39.0 - 52.0 %   Hemoglobin 6.1 (LL) 13.0 - 17.0 g/dL   Patient temperature HIDE    Collection site RADIAL, ALLEN'S TEST ACCEPTABLE    Drawn by Nurse    Sample type ARTERIAL   DIC (disseminated intravasc coag) panel (STAT)     Status: Abnormal   Collection Time: 06/18/19  9:49 AM  Result Value Ref Range   Prothrombin Time 17.9 (H) 11.4 - 15.2 seconds   INR 1.5 (H) 0.8 - 1.2    Comment: (NOTE) INR goal varies based on device and disease states.    aPTT 55 (H) 24 - 36 seconds    Comment:        IF BASELINE aPTT IS ELEVATED, SUGGEST PATIENT RISK ASSESSMENT BE USED TO DETERMINE APPROPRIATE ANTICOAGULANT THERAPY.    Fibrinogen 231 210 - 475 mg/dL   D-Dimer, Quant 29.52 (H) 0.00 - 0.50 ug/mL-FEU    Comment: (NOTE) At the manufacturer cut-off of 0.50 ug/mL FEU, this assay has been documented to exclude PE with a sensitivity and negative predictive value of 97 to 99%.  At  this time, this assay has not been approved by the FDA to exclude DVT/VTE. Results should be correlated with clinical presentation.    Platelets 116 (L) 150 - 400 K/uL    Comment: REPEATED TO VERIFY Immature Platelet Fraction may be clinically indicated, consider ordering this additional test [id number] CONSISTENT WITH PREVIOUS RESULT    Smear Review NO SCHISTOCYTES SEEN     Comment: Performed at [facility], [address], [place], [place] [address]  Hemoglobin and hematocrit, blood (STAT)     Status: Abnormal   Collection Time: 06/18/19  9:49 AM  Result Value Ref Range   Hemoglobin 7.2 (L) 13.0 - 17.0 g/dL    Comment: REPEATED TO VERIFY   HCT 21.2 (L) 39.0 - 52.0 %    Comment: Performed at [facility], [address] [place], [place] [address]  I-STAT 7, (LYTES, BLD GAS, ICA, H+H)     Status: Abnormal   Collection Time: 06/18/19 10:29 AM  Result Value Ref Range   pH, Arterial 7.374 7.350 - 7.450   pCO2 arterial 56.1 (H) 32.0 - 48.0 mmHg   pO2, Arterial 84.0 83.0 - 108.0 mmHg   Bicarbonate 32.7 (H) 20.0 - 28.0 mmol/L   TCO2 34 (H) 22 - 32 mmol/L   O2 Saturation 96.0 %   Acid-Base Excess 6.0 (H) 0.0 - 2.0 mmol/L   Sodium 152 (H) 135 - 145 mmol/L   Potassium 3.6 3.5 - 5.1 mmol/L   Calcium, Ion 1.58 (HH) 1.15 - 1.40 mmol/L   HCT 36.0 (L) 39.0 - 52.0 %   Hemoglobin 12.2 (L) 13.0 - 17.0 g/dL   Patient temperature HIDE    Sample type ARTERIAL   I-STAT 7, (LYTES, BLD GAS, ICA, H+H)     Status: Abnormal   Collection Time: 06/18/19 11:53 AM  Result Value Ref Range   pH, Arterial 7.334 (L) 7.350 - 7.450   pCO2 arterial 63.7 (H) 32.0 - 48.0 mmHg   pO2, Arterial 74.0 (L) 83.0 - 108.0 mmHg   Bicarbonate 33.9 (H) 20.0 - 28.0 mmol/L   TCO2 36 (H) 22 - 32 mmol/L   O2 Saturation 93.0 %   Acid-Base Excess 7.0 (H) 0.0 - 2.0 mmol/L   Sodium 153 (H) 135 - 145 mmol/L   Potassium 3.1 (L) 3.5 - 5.1 mmol/L   Calcium, Ion 1.26  1.15 - 1.40 mmol/L   HCT 23.0 (L) 39.0 - 52.0 %    Hemoglobin 7.8 (L) 13.0 - 17.0 g/dL   Patient temperature HIDE    Collection site RADIAL, ALLEN'S TEST ACCEPTABLE    Drawn by Nurse    Sample type ARTERIAL   DIC (disseminated intravasc coag) panel     Status: Abnormal   Collection Time: 06/18/19 12:00 PM  Result Value Ref Range   Prothrombin Time 12.1 11.4 - 15.2 seconds   INR 0.9 0.8 - 1.2    Comment: (NOTE) INR goal varies based on device and disease states.    aPTT 49 (H) 24 - 36 seconds    Comment:        IF BASELINE aPTT IS ELEVATED, SUGGEST PATIENT RISK ASSESSMENT BE USED TO DETERMINE APPROPRIATE ANTICOAGULANT THERAPY.    Fibrinogen 301 210 - 475 mg/dL   D-Dimer, Quant 16.10 (H) 0.00 - 0.50 ug/mL-FEU    Comment: (NOTE) At the manufacturer cut-off of 0.50 ug/mL FEU, this assay has been documented to exclude PE with a sensitivity and negative predictive value of 97 to 99%.  At this time, this assay has not been approved by the FDA to exclude DVT/VTE. Results should be correlated with clinical presentation.    Platelets 86 (L) 150 - 400 K/uL    Comment: REPEATED TO VERIFY Immature Platelet Fraction may be clinically indicated, consider ordering this additional test [id number] CONSISTENT WITH PREVIOUS RESULT    Smear Review NO SCHISTOCYTES SEEN     Comment: Performed at [facility], [address] [place], [place] [address]  I-STAT 7, (LYTES, BLD GAS, ICA, H+H)     Status: Abnormal   Collection Time: 06/18/19 12:39 PM  Result Value Ref Range   pH, Arterial 7.196 (LL) 7.350 - 7.450   pCO2 arterial 89.9 (HH) 32.0 - 48.0 mmHg   pO2, Arterial 45.0 (L) 83.0 - 108.0 mmHg   Bicarbonate 34.8 (H) 20.0 - 28.0 mmol/L   TCO2 38 (H) 22 - 32 mmol/L   O2 Saturation 67.0 %   Acid-Base Excess 5.0 (H) 0.0 - 2.0 mmol/L   Sodium 153 (H) 135 - 145 mmol/L   Potassium 3.4 (L) 3.5 - 5.1 mmol/L   Calcium, Ion 1.57 (HH) 1.15 - 1.40 mmol/L   HCT 24.0 (L) 39.0 - 52.0 %   Hemoglobin 8.2 (L) 13.0 - 17.0 g/dL   Patient temperature  HIDE    Collection site RADIAL, ALLEN'S TEST ACCEPTABLE    Drawn by Nurse    Sample type ARTERIAL   Prepare Pheresed Platelets     Status: None   Collection Time: 06/18/19 12:47 PM  Result Value Ref Range   Unit Number [id number]    Blood Component Type PLTPHER LR2    Unit division 00    Status of Unit ISSUED,FINAL    Transfusion Status OK TO TRANSFUSE    Unit Number [id number]    Blood Component Type PLTP LI1 PAS    Unit division 00    Status of Unit ISSUED,FINAL    Transfusion Status      OK TO TRANSFUSE Performed at [facility], [address] [place], [place] [address]   Urinalysis, Routine w reflex microscopic     Status: Abnormal   Collection Time: 06/18/19  2:06 PM  Result Value Ref Range   Color, Urine STRAW (A) YELLOW   APPearance CLEAR CLEAR   Specific Gravity, Urine 1.006 1.005 - 1.030   pH 7.0 5.0 -  8.0   Glucose, UA NEGATIVE NEGATIVE mg/dL   Hgb urine dipstick LARGE (A) NEGATIVE   Bilirubin Urine NEGATIVE NEGATIVE   Ketones, ur NEGATIVE NEGATIVE mg/dL   Protein, ur NEGATIVE NEGATIVE mg/dL   Nitrite NEGATIVE NEGATIVE   Leukocytes,Ua NEGATIVE NEGATIVE   RBC / HPF 11-20 0 - 5 RBC/hpf   WBC, UA 0-5 0 - 5 WBC/hpf   Bacteria, UA NONE SEEN NONE SEEN   Mucus PRESENT     Comment: Performed at [facility], [address] [place], [place] [address]  CBC     Status: Abnormal   Collection Time: 06/18/19  2:06 PM  Result Value Ref Range   WBC 2.9 (L) 4.0 - 10.5 K/uL   RBC 3.81 (L) 4.22 - 5.81 MIL/uL   Hemoglobin 12.1 (L) 13.0 - 17.0 g/dL    Comment: REPEATED TO VERIFY POST TRANSFUSION SPECIMEN    HCT 33.3 (L) 39.0 - 52.0 %   MCV 87.4 80.0 - 100.0 fL   MCH 31.8 26.0 - 34.0 pg   MCHC 36.3 (H) 30.0 - 36.0 g/dL   RDW 60.413.8 54.011.5 - 98.115.5 %   Platelets 204 150 - 400 K/uL    Comment: REPEATED TO VERIFY POST TRANSFUSION SPECIMEN    nRBC 0.0 0.0 - 0.2 %    Comment: Performed at [facility], [address] [place], [place] [address]   Comprehensive metabolic panel     Status: Abnormal   Collection Time: 06/18/19  2:06 PM  Result Value Ref Range   Sodium 154 (H) 135 - 145 mmol/L   Potassium 3.8 3.5 - 5.1 mmol/L   Chloride 110 98 - 111 mmol/L   CO2 31 22 - 32 mmol/L   Glucose, Bld 79 70 - 99 mg/dL   BUN 10 6 - 20 mg/dL   Creatinine, Ser 8.291.32 (H) 0.61 - 1.24 mg/dL   Calcium 56.210.7 (H) 8.9 - 10.3 mg/dL    Comment: DELTA CHECK NOTED   Total Protein 5.0 (L) 6.5 - 8.1 g/dL   Albumin 3.2 (L) 3.5 - 5.0 g/dL   AST 130175 (H) 15 - 41 U/L   ALT 122 (H) 0 - 44 U/L   Alkaline Phosphatase 41 38 - 126 U/L   Total Bilirubin 1.1 0.3 - 1.2 mg/dL   GFR calc non Af Amer >60 >60 mL/min   GFR calc Af Amer >60 >60 mL/min   Anion gap 13 5 - 15    Comment: Performed at [facility], [address] [place], [place] [address]  Magnesium     Status: Abnormal   Collection Time: 06/18/19  2:06 PM  Result Value Ref Range   Magnesium 1.6 (L) 1.7 - 2.4 mg/dL    Comment: Performed at [facility], [address] [place], [place] [address]  Phosphorus     Status: Abnormal   Collection Time: 06/18/19  2:06 PM  Result Value Ref Range   Phosphorus 8.0 (H) 2.5 - 4.6 mg/dL    Comment: Performed at [facility], [address] [place], [place] [address]  Calcium, ionized     Status: Abnormal   Collection Time: 06/18/19  2:06 PM  Result Value Ref Range   Calcium, Ionized, Serum 5.8 (H) 4.5 - 5.6 mg/dL    Comment: (NOTE) Performed At: [facility] [address] [place], [place] [id number] Jolene SchimkeNagendra Sanjai MD VO:[id number]:[phone]   Protime-INR     Status: None   Collection Time: 06/18/19  2:06 PM  Result Value Ref Range   Prothrombin Time 12.5 11.4 - 15.2 seconds   INR 0.9 0.8 - 1.2    Comment: (NOTE) INR goal varies based on device and disease states. Performed at [facility], [address] [place], [place] [address]   Lactic acid, plasma     Status: Abnormal   Collection Time: 06/18/19  2:06 PM  Result Value  Ref Range   Lactic Acid, Venous 4.0 (HH) 0.5 - 1.9 mmol/L    Comment: CRITICAL VALUE NOTED.  VALUE IS CONSISTENT WITH PREVIOUSLY REPORTED AND CALLED VALUE. Performed at [facility], [address], [place], [place] [address]   I-STAT 7, (LYTES, BLD GAS, ICA, H+H)     Status: Abnormal   Collection Time: 06/18/19  2:06 PM  Result Value Ref Range   pH, Arterial 7.462 (H) 7.350 - 7.450   pCO2 arterial 47.2 32.0 - 48.0 mmHg   pO2, Arterial 53.0 (L) 83.0 - 108.0 mmHg   Bicarbonate 34.7 (H) 20.0 - 28.0 mmol/L   TCO2 36 (H) 22 - 32 mmol/L   O2 Saturation 92.0 %   Acid-Base Excess 9.0 (H) 0.0 - 2.0 mmol/L   Sodium 152 (H) 135 - 145 mmol/L   Potassium 3.8 3.5 - 5.1 mmol/L   Calcium, Ion 1.30 1.15 - 1.40 mmol/L   HCT 31.0 (L) 39.0 - 52.0 %   Hemoglobin 10.5 (L) 13.0 - 17.0 g/dL   Patient temperature 32.4 C    Sample type ARTERIAL   MRSA PCR Screening     Status: None   Collection Time: 06/18/19  2:57 PM   Specimen: Nasal Mucosa; Nasopharyngeal  Result Value Ref Range   MRSA by PCR NEGATIVE NEGATIVE    Comment:        The GeneXpert MRSA Assay (FDA approved for NASAL specimens only), is one component of a comprehensive MRSA colonization surveillance program. It is not intended to diagnose MRSA infection nor to guide or monitor treatment for MRSA infections. Performed at [facility], [address], [place], [place] [address]   Lactic acid, plasma     Status: Abnormal   Collection Time: 06/18/19  6:30 PM  Result Value Ref Range   Lactic Acid, Venous 3.2 (HH) 0.5 - 1.9 mmol/L    Comment: CRITICAL VALUE NOTED.  VALUE IS CONSISTENT WITH PREVIOUSLY REPORTED AND CALLED VALUE. Performed at [facility], [address], [place], [place] [address]   CBC     Status: Abnormal   Collection Time: 06/18/19  9:41 PM  Result Value Ref Range   WBC 4.4 4.0 - 10.5 K/uL   RBC 4.32 4.22 - 5.81 MIL/uL   Hemoglobin 13.2 13.0 - 17.0 g/dL    Comment: REPEATED TO VERIFY POST  TRANSFUSION SPECIMEN    HCT 35.9 (L) 39.0 - 52.0 %   MCV 83.1 80.0 - 100.0 fL   MCH 30.6 26.0 - 34.0 pg   MCHC 36.8 (H) 30.0 - 36.0 g/dL   RDW 64.4 03.4 - 74.2 %   Platelets 176 150 - 400 K/uL   nRBC 0.0 0.0 - 0.2 %    Comment: Performed at [facility], [address], [place], [place] [address]  Comprehensive metabolic panel     Status: Abnormal   Collection Time: 06/18/19  9:41 PM  Result Value Ref Range   Sodium 151 (H) 135 - 145 mmol/L   Potassium 4.2 3.5 - 5.1 mmol/L  Chloride 107 98 - 111 mmol/L   CO2 29 22 - 32 mmol/L   Glucose, Bld 78 70 - 99 mg/dL   BUN 18 6 - 20 mg/dL   Creatinine, Ser 1.61 (H) 0.61 - 1.24 mg/dL   Calcium 9.9 8.9 - 09.6 mg/dL   Total Protein 4.9 (L) 6.5 - 8.1 g/dL   Albumin 3.1 (L) 3.5 - 5.0 g/dL   AST 045 (H) 15 - 41 U/L    Comment: RESULTS CONFIRMED BY MANUAL DILUTION   ALT 140 (H) 0 - 44 U/L   Alkaline Phosphatase 41 38 - 126 U/L   Total Bilirubin 1.9 (H) 0.3 - 1.2 mg/dL   GFR calc non Af Amer 49 (L) >60 mL/min   GFR calc Af Amer 57 (L) >60 mL/min   Anion gap 15 5 - 15    Comment: Performed at [facility], [address], [place], [place] [address]  Glucose, capillary     Status: Abnormal   Collection Time: 06/18/19 11:11 PM  Result Value Ref Range   Glucose-Capillary 40 (LL) 70 - 99 mg/dL   Comment 1 Notify RN   Glucose, capillary     Status: None   Collection Time: 06/18/19 11:16 PM  Result Value Ref Range   Glucose-Capillary 85 70 - 99 mg/dL  CBC     Status: Abnormal   Collection Time: 06/19/19  3:31 AM  Result Value Ref Range   WBC 5.8 4.0 - 10.5 K/uL   RBC 4.12 (L) 4.22 - 5.81 MIL/uL   Hemoglobin 12.6 (L) 13.0 - 17.0 g/dL   HCT 19.1 (L) 47.8 - 29.5 %   MCV 85.7 80.0 - 100.0 fL   MCH 30.6 26.0 - 34.0 pg   MCHC 35.7 30.0 - 36.0 g/dL   RDW 62.1 30.8 - 65.7 %   Platelets 163 150 - 400 K/uL   nRBC 0.0 0.0 - 0.2 %    Comment: Performed at [facility], [address], [place], [place] [address]  Magnesium      Status: Abnormal   Collection Time: 06/19/19  3:31 AM  Result Value Ref Range   Magnesium 1.5 (L) 1.7 - 2.4 mg/dL    Comment: Performed at [facility], [address], [place], [place] [address]  Phosphorus     Status: Abnormal   Collection Time: 06/19/19  3:31 AM  Result Value Ref Range   Phosphorus 6.9 (H) 2.5 - 4.6 mg/dL    Comment: Performed at [facility], [address], [place], [place] [address]  Lactic acid, plasma     Status: Abnormal   Collection Time: 06/19/19  3:31 AM  Result Value Ref Range   Lactic Acid, Venous 3.0 (HH) 0.5 - 1.9 mmol/L    Comment: CRITICAL VALUE NOTED.  VALUE IS CONSISTENT WITH PREVIOUSLY REPORTED AND CALLED VALUE. Performed at [facility], [address], [place], [place] [address]   Glucose, capillary     Status: None   Collection Time: 06/19/19  3:35 AM  Result Value Ref Range   Glucose-Capillary 86 70 - 99 mg/dL  I-STAT 7, (LYTES, BLD GAS, ICA, H+H)     Status: Abnormal   Collection Time: 06/19/19  4:19 AM  Result Value Ref Range   pH, Arterial 7.464 (H) 7.350 - 7.450   pCO2 arterial 47.5 32.0 - 48.0 mmHg   pO2, Arterial 147.0 (H) 83.0 - 108.0 mmHg   Bicarbonate 33.8 (H) 20.0 - 28.0  mmol/L   TCO2 35 (H) 22 - 32 mmol/L   O2 Saturation 99.0 %   Acid-Base Excess 9.0 (H) 0.0 - 2.0 mmol/L   Sodium 151 (H) 135 - 145 mmol/L   Potassium 4.2 3.5 - 5.1 mmol/L   Calcium, Ion 1.14 (L) 1.15 - 1.40 mmol/L   HCT 33.0 (L) 39.0 - 52.0 %   Hemoglobin 11.2 (L) 13.0 - 17.0 g/dL   Patient temperature 045.4 F    Collection site ARTERIAL LINE    Sample type ARTERIAL   I-STAT 7, (LYTES, BLD GAS, ICA, H+H)     Status: Abnormal   Collection Time: 06/19/19  7:40 AM  Result Value Ref Range   pH, Arterial 7.522 (H) 7.350 - 7.450   pCO2 arterial 41.9 32.0 - 48.0 mmHg   pO2, Arterial 147.0 (H) 83.0 - 108.0 mmHg   Bicarbonate 34.4 (H) 20.0 - 28.0 mmol/L   TCO2 36 (H) 22 - 32 mmol/L   O2 Saturation 99.0 %   Acid-Base Excess 11.0 (H) 0.0 - 2.0  mmol/L   Sodium 151 (H) 135 - 145 mmol/L   Potassium 4.1 3.5 - 5.1 mmol/L   Calcium, Ion 1.07 (L) 1.15 - 1.40 mmol/L   HCT 30.0 (L) 39.0 - 52.0 %   Hemoglobin 10.2 (L) 13.0 - 17.0 g/dL   Patient temperature HIDE    Collection site ARTERIAL LINE    Drawn by RT    Sample type ARTERIAL   Glucose, capillary     Status: Abnormal   Collection Time: 06/19/19  7:53 AM  Result Value Ref Range   Glucose-Capillary 135 (H) 70 - 99 mg/dL   Comment 1 Notify RN    Comment 2 Document in Chart   Comprehensive metabolic panel     Status: Abnormal   Collection Time: 06/19/19  8:30 AM  Result Value Ref Range   Sodium 153 (H) 135 - 145 mmol/L   Potassium 4.2 3.5 - 5.1 mmol/L   Chloride 108 98 - 111 mmol/L   CO2 31 22 - 32 mmol/L   Glucose, Bld 121 (H) 70 - 99 mg/dL   BUN 22 (H) 6 - 20 mg/dL   Creatinine, Ser 0.98 (H) 0.61 - 1.24 mg/dL   Calcium 7.9 (L) 8.9 - 10.3 mg/dL   Total Protein 4.7 (L) 6.5 - 8.1 g/dL   Albumin 2.8 (L) 3.5 - 5.0 g/dL   AST 119 (H) 15 - 41 U/L    Comment: RESULTS CONFIRMED BY MANUAL DILUTION   ALT 127 (H) 0 - 44 U/L   Alkaline Phosphatase 42 38 - 126 U/L   Total Bilirubin 1.5 (H) 0.3 - 1.2 mg/dL   GFR calc non Af Amer

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
## 2019-06-20 ENCOUNTER — Encounter: Payer: Self-pay | Admitting: *Deleted

## 2019-06-20 ENCOUNTER — Other Ambulatory Visit: Payer: Self-pay

## 2019-06-20 ENCOUNTER — Inpatient Hospital Stay (HOSPITAL_COMMUNITY): Payer: Managed Care, Other (non HMO)

## 2019-06-20 LAB — PREPARE FRESH FROZEN PLASMA
Unit division: 0
Unit division: 0
Unit division: 0
Unit division: 0
Unit division: 0

## 2019-06-20 LAB — CALCIUM, IONIZED: Calcium, Ionized, Serum: 5.1 mg/dL (ref 4.5–5.6)

## 2019-06-20 LAB — POCT I-STAT 7, (LYTES, BLD GAS, ICA,H+H)
Acid-Base Excess: 7 mmol/L — ABNORMAL HIGH (ref 0.0–2.0)
Bicarbonate: 35.1 mmol/L — ABNORMAL HIGH (ref 20.0–28.0)
Calcium, Ion: 1.13 mmol/L — ABNORMAL LOW (ref 1.15–1.40)
HCT: 31 % — ABNORMAL LOW (ref 39.0–52.0)
Hemoglobin: 10.5 g/dL — ABNORMAL LOW (ref 13.0–17.0)
O2 Saturation: 96 %
Patient temperature: 37.3
Potassium: 3.9 mmol/L (ref 3.5–5.1)
Sodium: 150 mmol/L — ABNORMAL HIGH (ref 135–145)
TCO2: 37 mmol/L — ABNORMAL HIGH (ref 22–32)
pCO2 arterial: 68.6 mmHg (ref 32.0–48.0)
pH, Arterial: 7.318 — ABNORMAL LOW (ref 7.350–7.450)
pO2, Arterial: 92 mmHg (ref 83.0–108.0)

## 2019-06-20 LAB — MAGNESIUM
Magnesium: 2.4 mg/dL (ref 1.7–2.4)
Magnesium: 2 mg/dL (ref 1.7–2.4)

## 2019-06-20 LAB — GLUCOSE, CAPILLARY
Glucose-Capillary: 107 mg/dL — ABNORMAL HIGH (ref 70–99)
Glucose-Capillary: 125 mg/dL — ABNORMAL HIGH (ref 70–99)
Glucose-Capillary: 119 mg/dL — ABNORMAL HIGH (ref 70–99)
Glucose-Capillary: 110 mg/dL — ABNORMAL HIGH (ref 70–99)
Glucose-Capillary: 97 mg/dL (ref 70–99)
Glucose-Capillary: 117 mg/dL — ABNORMAL HIGH (ref 70–99)
Glucose-Capillary: 113 mg/dL — ABNORMAL HIGH (ref 70–99)

## 2019-06-20 LAB — BPAM FFP
Blood Product Expiration Date: 202012272359
Blood Product Expiration Date: 202012272359
Blood Product Expiration Date: 202012272359
Blood Product Expiration Date: 202012272359
Blood Product Expiration Date: 202012272359
Blood Product Expiration Date: 202012272359
ISSUE DATE / TIME: 202012221002
ISSUE DATE / TIME: 202012221002
ISSUE DATE / TIME: 202012231221
ISSUE DATE / TIME: 202012231221
Unit Type and Rh: 6200
Unit Type and Rh: 6200
Unit Type and Rh: 6200
Unit Type and Rh: 6200
Unit Type and Rh: 6200
Unit Type and Rh: 6200

## 2019-06-20 LAB — COMPREHENSIVE METABOLIC PANEL
ALT: 121 U/L — ABNORMAL HIGH (ref 0–44)
AST: 336 U/L — ABNORMAL HIGH (ref 15–41)
Albumin: 2.8 g/dL — ABNORMAL LOW (ref 3.5–5.0)
Alkaline Phosphatase: 48 U/L (ref 38–126)
Anion gap: 8 (ref 5–15)
BUN: 19 mg/dL (ref 6–20)
CO2: 32 mmol/L (ref 22–32)
Calcium: 7.4 mg/dL — ABNORMAL LOW (ref 8.9–10.3)
Chloride: 112 mmol/L — ABNORMAL HIGH (ref 98–111)
Creatinine, Ser: 1.48 mg/dL — ABNORMAL HIGH (ref 0.61–1.24)
GFR calc Af Amer: >60 mL/min (ref >60)
GFR calc non Af Amer: >60 mL/min (ref >60)
Glucose, Bld: 112 mg/dL — ABNORMAL HIGH (ref 70–99)
Potassium: 4.2 mmol/L (ref 3.5–5.1)
Sodium: 152 mmol/L — ABNORMAL HIGH (ref 135–145)
Total Bilirubin: 1 mg/dL (ref 0.3–1.2)
Total Protein: 4.8 g/dL — ABNORMAL LOW (ref 6.5–8.1)

## 2019-06-20 LAB — CBC
HCT: 36.4 % — ABNORMAL LOW (ref 39.0–52.0)
Hemoglobin: 12 g/dL — ABNORMAL LOW (ref 13.0–17.0)
MCH: 30 pg (ref 26.0–34.0)
MCHC: 33 g/dL (ref 30.0–36.0)
MCV: 91 fL (ref 80.0–100.0)
Platelets: 123 10*3/uL — ABNORMAL LOW (ref 150–400)
RBC: 4 MIL/uL — ABNORMAL LOW (ref 4.22–5.81)
RDW: 15.2 % (ref 11.5–15.5)
WBC: 10 10*3/uL (ref 4.0–10.5)
nRBC: 0 % (ref 0.0–0.2)

## 2019-06-20 LAB — LACTIC ACID, PLASMA: Lactic Acid, Venous: 1.4 mmol/L (ref 0.5–1.9)

## 2019-06-20 LAB — PHOSPHORUS
Phosphorus: 3.9 mg/dL (ref 2.5–4.6)
Phosphorus: 2.6 mg/dL (ref 2.5–4.6)

## 2019-06-20 LAB — SURGICAL PATHOLOGY

## 2019-06-20 MED ORDER — PANTOPRAZOLE SODIUM 40 MG PO PACK: 40.0000 mg | PACK | Freq: Every day | ORAL | Status: DC

## 2019-06-20 MED ORDER — DEXMEDETOMIDINE HCL IN NACL 400 MCG/100ML IV SOLN
0.4000 ug/kg/h | INTRAVENOUS | Status: DC
  Administered 2019-06-20: 11:00:00 0.5 ug/kg/h via INTRAVENOUS
  Administered 2019-06-21: 08:00:00 1 ug/kg/h via INTRAVENOUS
  Administered 2019-06-21 – 2019-06-23 (×5): 0.6 ug/kg/h via INTRAVENOUS
  Filled 2019-06-20 (×10): qty 100

## 2019-06-20 MED FILL — Medication: Qty: 1 | Status: AC
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
## 2019-06-21 ENCOUNTER — Inpatient Hospital Stay (HOSPITAL_COMMUNITY): Payer: Managed Care, Other (non HMO)

## 2019-06-21 LAB — CBC
HCT: 31.9 % — ABNORMAL LOW (ref 39.0–52.0)
Hemoglobin: 10.2 g/dL — ABNORMAL LOW (ref 13.0–17.0)
MCH: 29.7 pg (ref 26.0–34.0)
MCHC: 32 g/dL (ref 30.0–36.0)
MCV: 93 fL (ref 80.0–100.0)
Platelets: 84 10*3/uL — ABNORMAL LOW (ref 150–400)
RBC: 3.43 MIL/uL — ABNORMAL LOW (ref 4.22–5.81)
RDW: 14.5 % (ref 11.5–15.5)
WBC: 7.8 10*3/uL (ref 4.0–10.5)
nRBC: 0 % (ref 0.0–0.2)

## 2019-06-21 LAB — POCT I-STAT 7, (LYTES, BLD GAS, ICA,H+H)
Acid-Base Excess: 4 mmol/L — ABNORMAL HIGH (ref 0.0–2.0)
Bicarbonate: 28.4 mmol/L — ABNORMAL HIGH (ref 20.0–28.0)
Calcium, Ion: 1.23 mmol/L (ref 1.15–1.40)
HCT: 27 % — ABNORMAL LOW (ref 39.0–52.0)
Hemoglobin: 9.2 g/dL — ABNORMAL LOW (ref 13.0–17.0)
O2 Saturation: 99 %
Patient temperature: 98.6
Potassium: 4 mmol/L (ref 3.5–5.1)
Sodium: 147 mmol/L — ABNORMAL HIGH (ref 135–145)
TCO2: 30 mmol/L (ref 22–32)
pCO2 arterial: 43.4 mmHg (ref 32.0–48.0)
pH, Arterial: 7.423 (ref 7.350–7.450)
pO2, Arterial: 139 mmHg — ABNORMAL HIGH (ref 83.0–108.0)

## 2019-06-21 LAB — BASIC METABOLIC PANEL
Anion gap: 6 (ref 5–15)
BUN: 16 mg/dL (ref 6–20)
CO2: 24 mmol/L (ref 22–32)
Calcium: 8.2 mg/dL — ABNORMAL LOW (ref 8.9–10.3)
Chloride: 115 mmol/L — ABNORMAL HIGH (ref 98–111)
Creatinine, Ser: 0.93 mg/dL (ref 0.61–1.24)
GFR calc Af Amer: >60 mL/min (ref >60)
GFR calc non Af Amer: >60 mL/min (ref >60)
Glucose, Bld: 118 mg/dL — ABNORMAL HIGH (ref 70–99)
Potassium: 4.1 mmol/L (ref 3.5–5.1)
Sodium: 145 mmol/L (ref 135–145)

## 2019-06-21 LAB — GLUCOSE, CAPILLARY
Glucose-Capillary: 102 mg/dL — ABNORMAL HIGH (ref 70–99)
Glucose-Capillary: 121 mg/dL — ABNORMAL HIGH (ref 70–99)
Glucose-Capillary: 128 mg/dL — ABNORMAL HIGH (ref 70–99)
Glucose-Capillary: 129 mg/dL — ABNORMAL HIGH (ref 70–99)

## 2019-06-21 LAB — PHOSPHORUS: Phosphorus: 1.6 mg/dL — ABNORMAL LOW (ref 2.5–4.6)

## 2019-06-21 LAB — MAGNESIUM: Magnesium: 1.8 mg/dL (ref 1.7–2.4)

## 2019-06-21 MED ORDER — LORAZEPAM 2 MG/ML IJ SOLN: 0.5000 mg | INTRAMUSCULAR | Status: DC | PRN

## 2019-06-21 MED ORDER — MAGNESIUM SULFATE 2 GM/50ML IV SOLN
2.0000 g | Freq: Once | INTRAVENOUS | Status: AC
  Administered 2019-06-21: 2 g via INTRAVENOUS
  Filled 2019-06-21: qty 50

## 2019-06-21 MED ORDER — ENOXAPARIN SODIUM 30 MG/0.3ML ~~LOC~~ SOLN
30.0000 mg | Freq: Two times a day (BID) | SUBCUTANEOUS | Status: DC
  Administered 2019-06-21 – 2019-06-24 (×7): 30 mg via SUBCUTANEOUS
  Filled 2019-06-21 (×7): qty 0.3

## 2019-06-21 MED ORDER — METHOCARBAMOL 500 MG PO TABS
1000.0000 mg | ORAL_TABLET | Freq: Three times a day (TID) | ORAL | Status: DC
  Administered 2019-06-21 – 2019-06-22 (×2): 1000 mg
  Filled 2019-06-21: qty 2

## 2019-06-21 MED ORDER — SODIUM PHOSPHATES 45 MMOLE/15ML IV SOLN
30.0000 mmol | Freq: Once | INTRAVENOUS | Status: AC
  Administered 2019-06-21: 30 mmol via INTRAVENOUS
  Filled 2019-06-21: qty 10

## 2019-06-21 MED ORDER — ACETAMINOPHEN 500 MG PO TABS: 1000.0000 mg | ORAL_TABLET | Freq: Four times a day (QID) | ORAL | Status: DC

## 2019-06-21 MED ORDER — LORAZEPAM 2 MG/ML IJ SOLN
1.0000 mg | INTRAMUSCULAR | Status: DC | PRN
  Administered 2019-06-21 – 2019-06-23 (×5): 1 mg via INTRAVENOUS
  Filled 2019-06-21 (×5): qty 1

## 2019-06-21 MED ORDER — MIDAZOLAM HCL 2 MG/2ML IJ SOLN
0.5000 mg | INTRAMUSCULAR | Status: AC | PRN
  Administered 2019-06-21 (×3): 0.5 mg via INTRAVENOUS
  Filled 2019-06-21 (×4): qty 2

## 2019-06-21 MED ORDER — METHOCARBAMOL 500 MG PO TABS
1000.0000 mg | ORAL_TABLET | Freq: Three times a day (TID) | ORAL | Status: DC
  Filled 2019-06-21: qty 2

## 2019-06-21 MED ORDER — MORPHINE SULFATE (PF) 4 MG/ML IV SOLN
4.0000 mg | INTRAVENOUS | Status: DC | PRN
  Administered 2019-06-21 – 2019-06-23 (×5): 4 mg via INTRAVENOUS
  Filled 2019-06-21 (×5): qty 1

## 2019-06-21 MED ORDER — OXYCODONE HCL 5 MG/5ML PO SOLN
5.0000 mg | ORAL | Status: DC | PRN
  Administered 2019-06-21 – 2019-06-22 (×3): 10 mg
  Filled 2019-06-21 (×3): qty 10

## 2019-06-21 MED ORDER — ACETAMINOPHEN 500 MG PO TABS
1000.0000 mg | ORAL_TABLET | Freq: Four times a day (QID) | ORAL | Status: DC
  Administered 2019-06-21 – 2019-06-22 (×4): 1000 mg
  Filled 2019-06-21 (×4): qty 2

## 2019-06-21 NOTE — Progress Notes (Signed)
ABG drawn on the following settings:PRVC/12/520/40%+5
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
## 2019-06-21 NOTE — Procedures (Signed)
Extubation Procedure Note  Patient Details:   Name: Bryce Pace DOB: 1995/10/28 MRN: [id number]   Airway Documentation:    Vent end date: 06/21/19 Vent end time: 0900   Evaluation  O2 sats: stable throughout Complications: No apparent complications Patient did tolerate procedure well. Bilateral Breath Sounds: Clear, Diminished   Yes   Pt extubated to 6L N/C.  No stridor noted.  RN @ bedside.  Donnetta Hail 06/21/2019, 9:05 AM
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
## 2019-06-22 ENCOUNTER — Inpatient Hospital Stay (HOSPITAL_COMMUNITY): Payer: Managed Care, Other (non HMO)

## 2019-06-22 LAB — CBC
HCT: 33.3 % — ABNORMAL LOW (ref 39.0–52.0)
Hemoglobin: 11.1 g/dL — ABNORMAL LOW (ref 13.0–17.0)
MCH: 30.2 pg (ref 26.0–34.0)
MCHC: 33.3 g/dL (ref 30.0–36.0)
MCV: 90.5 fL (ref 80.0–100.0)
Platelets: 100 10*3/uL — ABNORMAL LOW (ref 150–400)
RBC: 3.68 MIL/uL — ABNORMAL LOW (ref 4.22–5.81)
RDW: 13.7 % (ref 11.5–15.5)
WBC: 9.2 10*3/uL (ref 4.0–10.5)
nRBC: 0 % (ref 0.0–0.2)

## 2019-06-22 LAB — GLUCOSE, CAPILLARY
Glucose-Capillary: 97 mg/dL (ref 70–99)
Glucose-Capillary: 120 mg/dL — ABNORMAL HIGH (ref 70–99)
Glucose-Capillary: 87 mg/dL (ref 70–99)

## 2019-06-22 LAB — BASIC METABOLIC PANEL
Anion gap: 6 (ref 5–15)
BUN: 13 mg/dL (ref 6–20)
CO2: 27 mmol/L (ref 22–32)
Calcium: 8.3 mg/dL — ABNORMAL LOW (ref 8.9–10.3)
Chloride: 112 mmol/L — ABNORMAL HIGH (ref 98–111)
Creatinine, Ser: 0.84 mg/dL (ref 0.61–1.24)
GFR calc Af Amer: >60 mL/min (ref >60)
GFR calc non Af Amer: >60 mL/min (ref >60)
Glucose, Bld: 113 mg/dL — ABNORMAL HIGH (ref 70–99)
Potassium: 3.6 mmol/L (ref 3.5–5.1)
Sodium: 145 mmol/L (ref 135–145)

## 2019-06-22 LAB — PHOSPHORUS: Phosphorus: 1.8 mg/dL — ABNORMAL LOW (ref 2.5–4.6)

## 2019-06-22 LAB — MAGNESIUM: Magnesium: 1.8 mg/dL (ref 1.7–2.4)

## 2019-06-22 MED ORDER — METHOCARBAMOL 500 MG PO TABS
1000.0000 mg | ORAL_TABLET | Freq: Three times a day (TID) | ORAL | Status: DC
  Administered 2019-06-22 – 2019-06-29 (×21): 1000 mg via ORAL
  Filled 2019-06-22 (×21): qty 2

## 2019-06-22 MED ORDER — MAGNESIUM SULFATE 2 GM/50ML IV SOLN
2.0000 g | Freq: Once | INTRAVENOUS | Status: AC
  Administered 2019-06-22: 09:00:00 2 g via INTRAVENOUS
  Filled 2019-06-22: qty 50

## 2019-06-22 MED ORDER — ACETAMINOPHEN 500 MG PO TABS
1000.0000 mg | ORAL_TABLET | Freq: Four times a day (QID) | ORAL | Status: DC
  Administered 2019-06-22 – 2019-06-29 (×23): 1000 mg via ORAL
  Filled 2019-06-22 (×25): qty 2

## 2019-06-22 MED ORDER — K PHOS MONO-SOD PHOS DI & MONO 155-852-130 MG PO TABS
500.0000 mg | ORAL_TABLET | Freq: Once | ORAL | Status: AC
  Administered 2019-06-22: 10:00:00 500 mg via ORAL
  Filled 2019-06-22: qty 2

## 2019-06-22 MED ORDER — OXYCODONE HCL 5 MG/5ML PO SOLN
5.0000 mg | ORAL | Status: DC | PRN
  Administered 2019-06-22 – 2019-06-28 (×20): 10 mg via ORAL
  Administered 2019-06-28: 5 mg via ORAL
  Administered 2019-06-29 (×2): 10 mg via ORAL
  Filled 2019-06-22 (×21): qty 10
  Filled 2019-06-22: qty 5
  Filled 2019-06-22 (×2): qty 10
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
## 2019-06-23 ENCOUNTER — Encounter (HOSPITAL_COMMUNITY): Payer: Self-pay

## 2019-06-23 ENCOUNTER — Inpatient Hospital Stay (HOSPITAL_COMMUNITY): Payer: Managed Care, Other (non HMO)

## 2019-06-23 DIAGNOSIS — T796XXA Traumatic ischemia of muscle, initial encounter: Secondary | ICD-10-CM

## 2019-06-23 DIAGNOSIS — S52291A Other fracture of shaft of right ulna, initial encounter for closed fracture: Secondary | ICD-10-CM | POA: Diagnosis present

## 2019-06-23 HISTORY — DX: Traumatic ischemia of muscle, initial encounter: T79.6XXA

## 2019-06-23 HISTORY — DX: Other fracture of shaft of right ulna, initial encounter for closed fracture: S52.291A

## 2019-06-23 LAB — GLUCOSE, CAPILLARY
Glucose-Capillary: 93 mg/dL (ref 70–99)
Glucose-Capillary: 111 mg/dL — ABNORMAL HIGH (ref 70–99)
Glucose-Capillary: 109 mg/dL — ABNORMAL HIGH (ref 70–99)
Glucose-Capillary: 117 mg/dL — ABNORMAL HIGH (ref 70–99)

## 2019-06-23 LAB — BASIC METABOLIC PANEL
Anion gap: 5 (ref 5–15)
BUN: 12 mg/dL (ref 6–20)
CO2: 27 mmol/L (ref 22–32)
Calcium: 8 mg/dL — ABNORMAL LOW (ref 8.9–10.3)
Chloride: 109 mmol/L (ref 98–111)
Creatinine, Ser: 0.84 mg/dL (ref 0.61–1.24)
GFR calc Af Amer: >60 mL/min (ref >60)
GFR calc non Af Amer: >60 mL/min (ref >60)
Glucose, Bld: 97 mg/dL (ref 70–99)
Potassium: 3.6 mmol/L (ref 3.5–5.1)
Sodium: 141 mmol/L (ref 135–145)

## 2019-06-23 LAB — PHOSPHORUS: Phosphorus: 2.9 mg/dL (ref 2.5–4.6)

## 2019-06-23 LAB — CBC
HCT: 35.1 % — ABNORMAL LOW (ref 39.0–52.0)
Hemoglobin: 11.4 g/dL — ABNORMAL LOW (ref 13.0–17.0)
MCH: 29.7 pg (ref 26.0–34.0)
MCHC: 32.5 g/dL (ref 30.0–36.0)
MCV: 91.4 fL (ref 80.0–100.0)
Platelets: 134 10*3/uL — ABNORMAL LOW (ref 150–400)
RBC: 3.84 MIL/uL — ABNORMAL LOW (ref 4.22–5.81)
RDW: 13.5 % (ref 11.5–15.5)
WBC: 7.5 10*3/uL (ref 4.0–10.5)
nRBC: 0 % (ref 0.0–0.2)

## 2019-06-23 LAB — MAGNESIUM: Magnesium: 1.9 mg/dL (ref 1.7–2.4)

## 2019-06-23 MED ORDER — MORPHINE SULFATE (PF) 4 MG/ML IV SOLN
4.0000 mg | INTRAVENOUS | Status: DC | PRN
  Administered 2019-06-24 – 2019-06-25 (×4): 4 mg via INTRAVENOUS
  Filled 2019-06-23 (×4): qty 1

## 2019-06-23 MED ORDER — LORAZEPAM 2 MG/ML IJ SOLN
0.5000 mg | INTRAMUSCULAR | Status: DC | PRN
  Administered 2019-06-23 – 2019-06-28 (×7): 0.5 mg via INTRAVENOUS
  Filled 2019-06-23 (×7): qty 1

## 2019-06-23 MED ORDER — MAGNESIUM SULFATE IN D5W 1-5 GM/100ML-% IV SOLN
1.0000 g | Freq: Once | INTRAVENOUS | Status: AC
  Administered 2019-06-23: 12:00:00 1 g via INTRAVENOUS
  Filled 2019-06-23: qty 100

## 2019-06-23 MED ORDER — ENSURE ENLIVE PO LIQD
237.0000 mL | Freq: Three times a day (TID) | ORAL | Status: DC
  Administered 2019-06-23 – 2019-06-28 (×11): 237 mL via ORAL
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
## 2019-06-23 NOTE — Progress Notes (Signed)
Consult was placed to start 2 Peripheral iv sites, and to remove the pt's central access;  Pt is very limited to only using one arm for all iv access and labwork;  Pt is also scheduled for surgery on 06/23/19 per RN;  Suggest keeping the current central access, as it was only placed on 06/18/19;  RN has paged the MD ;  Will have the RN place another consult to the IV Team if 2 IV sites are needed.  Thank you.
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
## 2019-06-24 ENCOUNTER — Inpatient Hospital Stay (HOSPITAL_COMMUNITY): Payer: Managed Care, Other (non HMO)

## 2019-06-24 LAB — CBC
HCT: 37.9 % — ABNORMAL LOW (ref 39.0–52.0)
Hemoglobin: 12.9 g/dL — ABNORMAL LOW (ref 13.0–17.0)
MCH: 29.7 pg (ref 26.0–34.0)
MCHC: 34 g/dL (ref 30.0–36.0)
MCV: 87.1 fL (ref 80.0–100.0)
Platelets: 196 10*3/uL (ref 150–400)
RBC: 4.35 MIL/uL (ref 4.22–5.81)
RDW: 13 % (ref 11.5–15.5)
WBC: 8.2 10*3/uL (ref 4.0–10.5)
nRBC: 0 % (ref 0.0–0.2)

## 2019-06-24 LAB — BASIC METABOLIC PANEL
Anion gap: 10 (ref 5–15)
BUN: 12 mg/dL (ref 6–20)
CO2: 27 mmol/L (ref 22–32)
Calcium: 8.2 mg/dL — ABNORMAL LOW (ref 8.9–10.3)
Chloride: 103 mmol/L (ref 98–111)
Creatinine, Ser: 0.77 mg/dL (ref 0.61–1.24)
GFR calc Af Amer: >60 mL/min (ref >60)
GFR calc non Af Amer: >60 mL/min (ref >60)
Glucose, Bld: 111 mg/dL — ABNORMAL HIGH (ref 70–99)
Potassium: 3.1 mmol/L — ABNORMAL LOW (ref 3.5–5.1)
Sodium: 140 mmol/L (ref 135–145)

## 2019-06-24 LAB — GLUCOSE, CAPILLARY
Glucose-Capillary: 125 mg/dL — ABNORMAL HIGH (ref 70–99)
Glucose-Capillary: 111 mg/dL — ABNORMAL HIGH (ref 70–99)
Glucose-Capillary: 111 mg/dL — ABNORMAL HIGH (ref 70–99)
Glucose-Capillary: 88 mg/dL (ref 70–99)

## 2019-06-24 LAB — MAGNESIUM: Magnesium: 1.7 mg/dL (ref 1.7–2.4)

## 2019-06-24 LAB — PHOSPHORUS: Phosphorus: 2.8 mg/dL (ref 2.5–4.6)

## 2019-06-24 MED ORDER — POLYETHYLENE GLYCOL 3350 17 G PO PACK: 17.0000 g | PACK | Freq: Every day | ORAL | Status: DC | PRN

## 2019-06-24 MED ORDER — HYDROXYZINE HCL 25 MG PO TABS
25.0000 mg | ORAL_TABLET | Freq: Three times a day (TID) | ORAL | Status: DC | PRN
  Administered 2019-06-28 (×2): 25 mg via ORAL
  Filled 2019-06-24 (×2): qty 1

## 2019-06-24 MED ORDER — DOCUSATE SODIUM 100 MG PO CAPS
100.0000 mg | ORAL_CAPSULE | Freq: Two times a day (BID) | ORAL | Status: DC
  Administered 2019-06-24 (×2): 100 mg via ORAL
  Filled 2019-06-24 (×2): qty 1

## 2019-06-24 MED ORDER — CITALOPRAM HYDROBROMIDE 10 MG PO TABS
10.0000 mg | ORAL_TABLET | Freq: Every day | ORAL | Status: DC
  Administered 2019-06-24 – 2019-06-29 (×5): 10 mg via ORAL
  Filled 2019-06-24 (×5): qty 1

## 2019-06-24 MED ORDER — POTASSIUM CHLORIDE CRYS ER 20 MEQ PO TBCR
30.0000 meq | EXTENDED_RELEASE_TABLET | Freq: Two times a day (BID) | ORAL | Status: AC
  Administered 2019-06-24 (×2): 30 meq via ORAL
  Filled 2019-06-24 (×2): qty 1

## 2019-06-24 MED FILL — Sodium Chloride IV Soln 0.9%: INTRAVENOUS | Qty: 8000 | Status: AC

## 2019-06-24 MED FILL — Heparin Sodium (Porcine) Inj 1000 Unit/ML: INTRAMUSCULAR | Qty: 60 | Status: AC
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
## 2019-06-25 ENCOUNTER — Inpatient Hospital Stay (HOSPITAL_COMMUNITY): Payer: Managed Care, Other (non HMO) | Admitting: Certified Registered Nurse Anesthetist

## 2019-06-25 ENCOUNTER — Encounter (HOSPITAL_COMMUNITY): Admission: EM | Disposition: A | Discharge status: Home / Self Care | Payer: Self-pay | Source: Home / Self Care

## 2019-06-25 ENCOUNTER — Inpatient Hospital Stay (HOSPITAL_COMMUNITY): Payer: Managed Care, Other (non HMO)

## 2019-06-25 ENCOUNTER — Encounter (HOSPITAL_COMMUNITY): Payer: Self-pay

## 2019-06-25 HISTORY — PX: ORIF ULNAR FRACTURE: SHX5417

## 2019-06-25 LAB — BASIC METABOLIC PANEL
Anion gap: 12 (ref 5–15)
BUN: 12 mg/dL (ref 6–20)
CO2: 25 mmol/L (ref 22–32)
Calcium: 8.6 mg/dL — ABNORMAL LOW (ref 8.9–10.3)
Chloride: 104 mmol/L (ref 98–111)
Creatinine, Ser: 0.79 mg/dL (ref 0.61–1.24)
GFR calc Af Amer: >60 mL/min (ref >60)
GFR calc non Af Amer: >60 mL/min (ref >60)
Glucose, Bld: 108 mg/dL — ABNORMAL HIGH (ref 70–99)
Potassium: 3.7 mmol/L (ref 3.5–5.1)
Sodium: 141 mmol/L (ref 135–145)

## 2019-06-25 LAB — CBC
HCT: 38.5 % — ABNORMAL LOW (ref 39.0–52.0)
Hemoglobin: 13.3 g/dL (ref 13.0–17.0)
MCH: 30.2 pg (ref 26.0–34.0)
MCHC: 34.5 g/dL (ref 30.0–36.0)
MCV: 87.3 fL (ref 80.0–100.0)
Platelets: 236 10*3/uL (ref 150–400)
RBC: 4.41 MIL/uL (ref 4.22–5.81)
RDW: 13.2 % (ref 11.5–15.5)
WBC: 9.8 10*3/uL (ref 4.0–10.5)
nRBC: 0 % (ref 0.0–0.2)

## 2019-06-25 LAB — GLUCOSE, CAPILLARY
Glucose-Capillary: 107 mg/dL — ABNORMAL HIGH (ref 70–99)
Glucose-Capillary: 142 mg/dL — ABNORMAL HIGH (ref 70–99)
Glucose-Capillary: 95 mg/dL (ref 70–99)
Glucose-Capillary: 96 mg/dL (ref 70–99)

## 2019-06-25 SURGERY — OPEN REDUCTION INTERNAL FIXATION (ORIF) ULNAR FRACTURE
Anesthesia: General | Laterality: Right

## 2019-06-25 MED ORDER — PROPOFOL 10 MG/ML IV BOLUS
INTRAVENOUS | Status: DC | PRN
  Administered 2019-06-25: 200 mg via INTRAVENOUS

## 2019-06-25 MED ORDER — CELECOXIB 200 MG PO CAPS: 400.0000 mg | ORAL_CAPSULE | Freq: Once | ORAL | Status: AC

## 2019-06-25 MED ORDER — ONDANSETRON HCL 4 MG PO TABS: 4.0000 mg | ORAL_TABLET | Freq: Four times a day (QID) | ORAL | Status: DC | PRN

## 2019-06-25 MED ORDER — LIDOCAINE 2% (20 MG/ML) 5 ML SYRINGE
INTRAMUSCULAR | Status: AC
  Filled 2019-06-25: qty 5

## 2019-06-25 MED ORDER — ALBUMIN HUMAN 5 % IV SOLN: INTRAVENOUS | Status: DC | PRN

## 2019-06-25 MED ORDER — ONDANSETRON HCL 4 MG/2ML IJ SOLN
4.0000 mg | Freq: Four times a day (QID) | INTRAMUSCULAR | Status: DC | PRN
  Administered 2019-06-25: 02:00:00 4 mg via INTRAVENOUS
  Filled 2019-06-25: qty 2

## 2019-06-25 MED ORDER — CEFAZOLIN SODIUM-DEXTROSE 2-4 GM/100ML-% IV SOLN
2.0000 g | Freq: Four times a day (QID) | INTRAVENOUS | Status: AC
  Administered 2019-06-25 – 2019-06-26 (×3): 2 g via INTRAVENOUS
  Filled 2019-06-25 (×3): qty 100

## 2019-06-25 MED ORDER — FENTANYL CITRATE (PF) 250 MCG/5ML IJ SOLN
INTRAMUSCULAR | Status: DC | PRN
  Administered 2019-06-25: 50 ug via INTRAVENOUS
  Administered 2019-06-25 (×2): 25 ug via INTRAVENOUS

## 2019-06-25 MED ORDER — DEXAMETHASONE SODIUM PHOSPHATE 10 MG/ML IJ SOLN
INTRAMUSCULAR | Status: DC | PRN
  Administered 2019-06-25: 5 mg via INTRAVENOUS

## 2019-06-25 MED ORDER — PHENYLEPHRINE 40 MCG/ML (10ML) SYRINGE FOR IV PUSH (FOR BLOOD PRESSURE SUPPORT)
PREFILLED_SYRINGE | INTRAVENOUS | Status: DC | PRN
  Administered 2019-06-25: 40 ug via INTRAVENOUS
  Administered 2019-06-25: 80 ug via INTRAVENOUS

## 2019-06-25 MED ORDER — ONDANSETRON HCL 4 MG/2ML IJ SOLN
INTRAMUSCULAR | Status: DC | PRN
  Administered 2019-06-25: 4 mg via INTRAVENOUS

## 2019-06-25 MED ORDER — LACTATED RINGERS IV SOLN: INTRAVENOUS | Status: DC

## 2019-06-25 MED ORDER — ONDANSETRON HCL 4 MG/2ML IJ SOLN: 4.0000 mg | Freq: Four times a day (QID) | INTRAMUSCULAR | Status: DC | PRN

## 2019-06-25 MED ORDER — MAGNESIUM CITRATE PO SOLN: 1.0000 | Freq: Once | ORAL | Status: DC | PRN

## 2019-06-25 MED ORDER — CEFAZOLIN SODIUM-DEXTROSE 2-4 GM/100ML-% IV SOLN
2.0000 g | INTRAVENOUS | Status: AC
  Administered 2019-06-25: 2 g via INTRAVENOUS

## 2019-06-25 MED ORDER — POVIDONE-IODINE 10 % EX SWAB: 2.0000 "application " | Freq: Once | CUTANEOUS | Status: DC

## 2019-06-25 MED ORDER — ONDANSETRON HCL 4 MG/2ML IJ SOLN
INTRAMUSCULAR | Status: AC
  Filled 2019-06-25: qty 2

## 2019-06-25 MED ORDER — MIDAZOLAM HCL 2 MG/2ML IJ SOLN
INTRAMUSCULAR | Status: AC
  Filled 2019-06-25: qty 2

## 2019-06-25 MED ORDER — BISACODYL 10 MG RE SUPP: 10.0000 mg | Freq: Every day | RECTAL | Status: DC | PRN

## 2019-06-25 MED ORDER — ENSURE PRE-SURGERY PO LIQD: 296.0000 mL | Freq: Once | ORAL | Status: DC

## 2019-06-25 MED ORDER — POLYETHYLENE GLYCOL 3350 17 G PO PACK
17.0000 g | PACK | Freq: Every day | ORAL | Status: DC
  Administered 2019-06-26 – 2019-06-29 (×2): 17 g via ORAL
  Filled 2019-06-25 (×3): qty 1

## 2019-06-25 MED ORDER — MIDAZOLAM HCL 2 MG/2ML IJ SOLN
INTRAMUSCULAR | Status: DC | PRN
  Administered 2019-06-25: 2 mg via INTRAVENOUS

## 2019-06-25 MED ORDER — FENTANYL CITRATE (PF) 100 MCG/2ML IJ SOLN
100.0000 ug | Freq: Once | INTRAMUSCULAR | Status: AC
  Administered 2019-06-25: 10:00:00 100 ug via INTRAVENOUS

## 2019-06-25 MED ORDER — ACETAMINOPHEN 500 MG PO TABS
ORAL_TABLET | ORAL | Status: AC
  Administered 2019-06-25: 10:00:00 1000 mg via ORAL
  Filled 2019-06-25: qty 2

## 2019-06-25 MED ORDER — DEXMEDETOMIDINE HCL IN NACL 80 MCG/20ML IV SOLN
INTRAVENOUS | Status: AC
  Filled 2019-06-25: qty 20

## 2019-06-25 MED ORDER — CHLORHEXIDINE GLUCONATE 4 % EX LIQD: 60.0000 mL | Freq: Once | CUTANEOUS | Status: DC

## 2019-06-25 MED ORDER — FENTANYL CITRATE (PF) 100 MCG/2ML IJ SOLN
INTRAMUSCULAR | Status: AC
  Filled 2019-06-25: qty 2

## 2019-06-25 MED ORDER — SODIUM CHLORIDE 0.9 % IR SOLN
Status: DC | PRN
  Administered 2019-06-25: 1000 mL

## 2019-06-25 MED ORDER — FENTANYL CITRATE (PF) 250 MCG/5ML IJ SOLN
INTRAMUSCULAR | Status: AC
  Filled 2019-06-25: qty 5

## 2019-06-25 MED ORDER — DOCUSATE SODIUM 100 MG PO CAPS
100.0000 mg | ORAL_CAPSULE | Freq: Two times a day (BID) | ORAL | Status: DC
  Administered 2019-06-25 – 2019-06-29 (×7): 100 mg via ORAL
  Filled 2019-06-25 (×8): qty 1

## 2019-06-25 MED ORDER — PROMETHAZINE HCL 25 MG/ML IJ SOLN: 6.2500 mg | INTRAMUSCULAR | Status: DC | PRN

## 2019-06-25 MED ORDER — KETAMINE HCL 50 MG/5ML IJ SOSY
PREFILLED_SYRINGE | INTRAMUSCULAR | Status: AC
  Filled 2019-06-25: qty 5

## 2019-06-25 MED ORDER — HYDROMORPHONE HCL 1 MG/ML IJ SOLN: 0.2500 mg | INTRAMUSCULAR | Status: DC | PRN

## 2019-06-25 MED ORDER — LIDOCAINE HCL (CARDIAC) PF 100 MG/5ML IV SOSY
PREFILLED_SYRINGE | INTRAVENOUS | Status: DC | PRN
  Administered 2019-06-25: 100 mg via INTRATRACHEAL

## 2019-06-25 MED ORDER — PROPOFOL 10 MG/ML IV BOLUS
INTRAVENOUS | Status: AC
  Filled 2019-06-25: qty 20

## 2019-06-25 MED ORDER — CELECOXIB 200 MG PO CAPS
ORAL_CAPSULE | ORAL | Status: AC
  Administered 2019-06-25: 10:00:00 400 mg via ORAL
  Filled 2019-06-25: qty 2

## 2019-06-25 MED ORDER — CEFAZOLIN SODIUM-DEXTROSE 2-4 GM/100ML-% IV SOLN
INTRAVENOUS | Status: AC
  Filled 2019-06-25: qty 100

## 2019-06-25 MED ORDER — DEXAMETHASONE SODIUM PHOSPHATE 10 MG/ML IJ SOLN
INTRAMUSCULAR | Status: AC
  Filled 2019-06-25: qty 1

## 2019-06-25 MED ORDER — ACETAMINOPHEN 500 MG PO TABS: 1000.0000 mg | ORAL_TABLET | Freq: Once | ORAL | Status: AC

## 2019-06-25 SURGICAL SUPPLY — 52 items
BIT DRILL 2.8 QR W/DEPTH MARKS (BIT) ×3 IMPLANT
BLADE CLIPPER SURG (BLADE) IMPLANT
BNDG ELASTIC 3X5.8 VLCR STR LF (GAUZE/BANDAGES/DRESSINGS) ×6 IMPLANT
BNDG ELASTIC 4X5.8 VLCR STR LF (GAUZE/BANDAGES/DRESSINGS) ×6 IMPLANT
BNDG GAUZE ELAST 4 BULKY (GAUZE/BANDAGES/DRESSINGS) ×6 IMPLANT
CLOSURE STERI-STRIP 1/2X4 (GAUZE/BANDAGES/DRESSINGS) ×1
CLSR STERI-STRIP ANTIMIC 1/2X4 (GAUZE/BANDAGES/DRESSINGS) ×2 IMPLANT
COVER SURGICAL LIGHT HANDLE (MISCELLANEOUS) ×6 IMPLANT
DRAPE U-SHAPE 47X51 STRL (DRAPES) ×3 IMPLANT
GAUZE SPONGE 4X4 12PLY STRL (GAUZE/BANDAGES/DRESSINGS) ×6 IMPLANT
GLOVE BIOGEL PI ORTHO PRO SZ8 (GLOVE) ×2
GLOVE ORTHO TXT STRL SZ7.5 (GLOVE) ×3 IMPLANT
GLOVE PI ORTHO PRO STRL SZ8 (GLOVE) ×1 IMPLANT
GLOVE SS BIOGEL STRL SZ 8 (GLOVE) ×1 IMPLANT
GLOVE SUPERSENSE BIOGEL SZ 8 (GLOVE) ×2
GLOVE SURG ORTHO 8.0 STRL STRW (GLOVE) ×6 IMPLANT
GOWN STRL REUS W/ TWL LRG LVL3 (GOWN DISPOSABLE) IMPLANT
GOWN STRL REUS W/TWL LRG LVL3 (GOWN DISPOSABLE)
KIT BASIN OR (CUSTOM PROCEDURE TRAY) ×3 IMPLANT
KIT TURNOVER KIT B (KITS) ×3 IMPLANT
LOOP VESSEL MAXI BLUE (MISCELLANEOUS) ×3 IMPLANT
MANIFOLD NEPTUNE II (INSTRUMENTS) ×3 IMPLANT
NS IRRIG 1000ML POUR BTL (IV SOLUTION) ×6 IMPLANT
PACK ORTHO EXTREMITY (CUSTOM PROCEDURE TRAY) ×3 IMPLANT
PAD ARMBOARD 7.5X6 YLW CONV (MISCELLANEOUS) ×6 IMPLANT
PAD CAST 4YDX4 CTTN HI CHSV (CAST SUPPLIES) ×1 IMPLANT
PADDING CAST COTTON 4X4 STRL (CAST SUPPLIES) ×2
PLATE ULNA 12 HOLE (Plate) ×3 IMPLANT
PUTTY DBM STAGRAFT PLUS 2CC (Putty) ×3 IMPLANT
SCREW HEXALOBE LOCKING 3.5X14M (Screw) ×6 IMPLANT
SCREW HEXALOBE NON-LOCK 3.5X14 (Screw) ×9 IMPLANT
SCREW HEXALOBE NON-LOCK 3.5X16 (Screw) ×3 IMPLANT
SCREW LOCK 18X3.5X HEXALOBE (Screw) ×1 IMPLANT
SCREW LOCK 24X3.5X HEXALOBE (Screw) ×1 IMPLANT
SCREW LOCKING 3.5X18MM (Screw) ×2 IMPLANT
SCREW LOCKING 3.5X24 (Screw) ×2 IMPLANT
SCREW NON LOCKING HEX 3.5X24 (Screw) ×6 IMPLANT
SPONGE LAP 18X18 RF (DISPOSABLE) ×3 IMPLANT
SPONGE LAP 4X18 RFD (DISPOSABLE) ×3 IMPLANT
SUCTION FRAZIER HANDLE 10FR (MISCELLANEOUS) ×2
SUCTION TUBE FRAZIER 10FR DISP (MISCELLANEOUS) ×1 IMPLANT
SUT ETHILON 2 0 FS 18 (SUTURE) ×9 IMPLANT
SUT ETHILON 3 0 PS 1 (SUTURE) ×6 IMPLANT
SUT ETHILON 4 0 PS 2 18 (SUTURE) IMPLANT
SUT PROLENE 4 0 PS 2 18 (SUTURE) IMPLANT
SUT VIC AB 3-0 FS2 27 (SUTURE) ×9 IMPLANT
TAPE STRIPS DRAPE STRL (GAUZE/BANDAGES/DRESSINGS) ×3 IMPLANT
TOWEL GREEN STERILE (TOWEL DISPOSABLE) ×3 IMPLANT
TOWEL GREEN STERILE FF (TOWEL DISPOSABLE) ×3 IMPLANT
TUBE CONNECTING 12'X1/4 (SUCTIONS) ×1
TUBE CONNECTING 12X1/4 (SUCTIONS) ×2 IMPLANT
WATER STERILE IRR 1000ML POUR (IV SOLUTION) ×6 IMPLANT
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
## 2019-06-25 NOTE — Anesthesia Postprocedure Evaluation (Signed)
Anesthesia Post Note  Patient: Bryce Pace  Procedure(s) Performed: OPEN REDUCTION INTERNAL FIXATION (ORIF) ULNAR FRACTURE (Right )     Patient location during evaluation: PACU Anesthesia Type: General Level of consciousness: sedated Pain management: pain level controlled Vital Signs Assessment: post-procedure vital signs reviewed and stable Respiratory status: spontaneous breathing and respiratory function stable Cardiovascular status: stable Postop Assessment: no apparent nausea or vomiting Anesthetic complications: no    Last Vitals:  Vitals:   06/25/19 1326 06/25/19 1330  BP:  (!) 143/91  Pulse: (!) 119 (!) 113  Resp: (!) 21 18  Temp:  36.7 C  SpO2: 96% 95%    Last Pain:  Vitals:   06/25/19 1330  TempSrc:   PainSc: Asleep                 Margee Trentham DANIEL

## 2019-06-25 NOTE — Progress Notes (Signed)
The patient has been re-examined, and the chart reviewed, and there have been no interval changes to the documented history and physical.    The risks benefits and alternatives were discussed with the patient including but not limited to the risks of nonoperative treatment, versus surgical intervention including infection, bleeding, nerve injury, malunion, nonunion, the need for revision surgery, hardware prominence, hardware failure, the need for hardware removal, blood clots, cardiopulmonary complications, morbidity, mortality, among others, and they were willing to proceed.    Shayn Madole P Tyshawna Alarid, MD  

## 2019-06-25 NOTE — Progress Notes (Addendum)
      [address]       [place] [address]             [phone]      Mr. Muhammed was off the unit for a procedure. Chart reviewed:   He has been maintaining adequate O2 sats on RA. The chest tubes continue to have moderate drainage (222ml on left and 274ml on right for 24 hours). CXR, Hct, and chemistries are stable.  No new rec's from CTS standpoint. Will continue to follow. Anticipate removing CT's in the next day or so if progress continues.  Macarthur Critchley, PA-C [phone]   CXR today reviewed and is clear w/o pneumothorax Leave pleural drains in place to suction until drainage less than 100cc in one side for 24 hrs PCXR tomorrow
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
## 2019-06-25 NOTE — Anesthesia Procedure Notes (Signed)
Procedure Name: LMA Insertion Date/Time: 06/25/2019 10:40 AM Performed by: Kathryne Hitch, CRNA Pre-anesthesia Checklist: Patient identified, Emergency Drugs available, Suction available and Patient being monitored Patient Re-evaluated:Patient Re-evaluated prior to induction Oxygen Delivery Method: Circle system utilized Preoxygenation: Pre-oxygenation with 100% oxygen Induction Type: IV induction Ventilation: Mask ventilation without difficulty LMA: LMA inserted LMA Size: 4.0 Dental Injury: Teeth and Oropharynx as per pre-operative assessment

## 2019-06-25 NOTE — Transfer of Care (Signed)
Immediate Anesthesia Transfer of Care Note  Patient: Bryce Pace  Procedure(s) Performed: OPEN REDUCTION INTERNAL FIXATION (ORIF) ULNAR FRACTURE (Right )  Patient Location: PACU  Anesthesia Type:General  Level of Consciousness: drowsy and patient cooperative  Airway & Oxygen Therapy: Patient Spontanous Breathing and Patient connected to face mask oxygen  Post-op Assessment: Report given to RN and Post -op Vital signs reviewed and stable  Post vital signs: Reviewed and stable  Last Vitals:  Vitals Value Taken Time  BP    Temp    Pulse    Resp    SpO2      Last Pain:  Vitals:   06/25/19 0928  TempSrc:   PainSc: 10-Worst pain ever      Patients Stated Pain Goal: 2 (57/26/20 3559)  Complications: No apparent anesthesia complications

## 2019-06-26 ENCOUNTER — Inpatient Hospital Stay (HOSPITAL_COMMUNITY): Payer: Managed Care, Other (non HMO)

## 2019-06-26 ENCOUNTER — Encounter: Payer: Self-pay | Admitting: *Deleted

## 2019-06-26 LAB — GLUCOSE, CAPILLARY
Glucose-Capillary: 109 mg/dL — ABNORMAL HIGH (ref 70–99)
Glucose-Capillary: 119 mg/dL — ABNORMAL HIGH (ref 70–99)
Glucose-Capillary: 101 mg/dL — ABNORMAL HIGH (ref 70–99)
Glucose-Capillary: 121 mg/dL — ABNORMAL HIGH (ref 70–99)
Glucose-Capillary: 131 mg/dL — ABNORMAL HIGH (ref 70–99)

## 2019-06-26 MED ORDER — ENOXAPARIN SODIUM 40 MG/0.4ML ~~LOC~~ SOLN
40.0000 mg | SUBCUTANEOUS | Status: DC
  Administered 2019-06-26 – 2019-06-29 (×4): 40 mg via SUBCUTANEOUS
  Filled 2019-06-26 (×4): qty 0.4
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
## 2019-06-26 NOTE — Progress Notes (Signed)
Orthopedic Tech Progress Note Patient Details:  Bryce Pace 1995-10-29 [id number] Removed kerlix from around arm and applied webril and I did let the RN know. She was helping me at bedside.. she held patient while I applied a long arm splint  Ortho Devices Type of Ortho Device: Long arm splint Splint Material: Fiberglass Ortho Device/Splint Location: RUE Ortho Device/Splint Interventions: Application, Ordered   Post Interventions Patient Tolerated: Well Instructions Provided: Adjustment of device, Care of device   [name] 06/26/2019, 11:42 AM
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
## 2019-06-27 ENCOUNTER — Inpatient Hospital Stay (HOSPITAL_COMMUNITY): Payer: Managed Care, Other (non HMO)

## 2019-06-27 LAB — BASIC METABOLIC PANEL
Anion gap: 10 (ref 5–15)
BUN: 10 mg/dL (ref 6–20)
CO2: 26 mmol/L (ref 22–32)
Calcium: 8.5 mg/dL — ABNORMAL LOW (ref 8.9–10.3)
Chloride: 103 mmol/L (ref 98–111)
Creatinine, Ser: 0.59 mg/dL — ABNORMAL LOW (ref 0.61–1.24)
GFR calc Af Amer: >60 mL/min (ref >60)
GFR calc non Af Amer: >60 mL/min (ref >60)
Glucose, Bld: 108 mg/dL — ABNORMAL HIGH (ref 70–99)
Potassium: 3.3 mmol/L — ABNORMAL LOW (ref 3.5–5.1)
Sodium: 139 mmol/L (ref 135–145)

## 2019-06-27 LAB — CBC
HCT: 34.5 % — ABNORMAL LOW (ref 39.0–52.0)
Hemoglobin: 12 g/dL — ABNORMAL LOW (ref 13.0–17.0)
MCH: 30 pg (ref 26.0–34.0)
MCHC: 34.8 g/dL (ref 30.0–36.0)
MCV: 86.3 fL (ref 80.0–100.0)
Platelets: 452 10*3/uL — ABNORMAL HIGH (ref 150–400)
RBC: 4 MIL/uL — ABNORMAL LOW (ref 4.22–5.81)
RDW: 13.2 % (ref 11.5–15.5)
WBC: 11.6 10*3/uL — ABNORMAL HIGH (ref 4.0–10.5)
nRBC: 0 % (ref 0.0–0.2)

## 2019-06-27 LAB — GLUCOSE, CAPILLARY
Glucose-Capillary: 104 mg/dL — ABNORMAL HIGH (ref 70–99)
Glucose-Capillary: 105 mg/dL — ABNORMAL HIGH (ref 70–99)
Glucose-Capillary: 106 mg/dL — ABNORMAL HIGH (ref 70–99)
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
## 2019-06-27 NOTE — Plan of Care (Signed)
  Problem: Education: Goal: Knowledge of General Education information will improve Description: Including pain rating scale, medication(s)/side effects and non-pharmacologic comfort measures 06/27/2019 1516 by Lawanda Cousins, RN Outcome: Progressing 06/27/2019 1515 by Lawanda Cousins, RN Outcome: Progressing   Problem: Health Behavior/Discharge Planning: Goal: Ability to manage health-related needs will improve 06/27/2019 1516 by Lawanda Cousins, RN Outcome: Progressing 06/27/2019 1515 by Lawanda Cousins, RN Outcome: Progressing   Problem: Clinical Measurements: Goal: Ability to maintain clinical measurements within normal limits will improve 06/27/2019 1516 by Lawanda Cousins, RN Outcome: Progressing 06/27/2019 1515 by Lawanda Cousins, RN Outcome: Progressing Goal: Will remain free from infection 06/27/2019 1516 by Lawanda Cousins, RN Outcome: Progressing 06/27/2019 1515 by Lawanda Cousins, RN Outcome: Progressing Goal: Diagnostic test results will improve 06/27/2019 1516 by Lawanda Cousins, RN Outcome: Progressing 06/27/2019 1515 by Lawanda Cousins, RN Outcome: Progressing Goal: Respiratory complications will improve 06/27/2019 1516 by Lawanda Cousins, RN Outcome: Progressing 06/27/2019 1515 by Lawanda Cousins, RN Outcome: Progressing Goal: Cardiovascular complication will be avoided 06/27/2019 1516 by Lawanda Cousins, RN Outcome: Progressing 06/27/2019 1515 by Lawanda Cousins, RN Outcome: Progressing   Problem: Activity: Goal: Risk for activity intolerance will decrease 06/27/2019 1516 by Lawanda Cousins, RN Outcome: Progressing 06/27/2019 1515 by Lawanda Cousins, RN Outcome: Progressing   Problem: Nutrition: Goal: Adequate nutrition will be maintained 06/27/2019 1516 by Lawanda Cousins, RN Outcome: Progressing 06/27/2019 1515 by Lawanda Cousins, RN Outcome: Progressing   Problem: Coping: Goal: Level of anxiety will decrease 06/27/2019 1516 by Lawanda Cousins, RN Outcome:  Progressing 06/27/2019 1515 by Lawanda Cousins, RN Outcome: Progressing   Problem: Elimination: Goal: Will not experience complications related to bowel motility 06/27/2019 1516 by Lawanda Cousins, RN Outcome: Progressing 06/27/2019 1515 by Lawanda Cousins, RN Outcome: Progressing Goal: Will not experience complications related to urinary retention 06/27/2019 1516 by Lawanda Cousins, RN Outcome: Progressing 06/27/2019 1515 by Lawanda Cousins, RN Outcome: Progressing   Problem: Pain Managment: Goal: General experience of comfort will improve 06/27/2019 1516 by Lawanda Cousins, RN Outcome: Progressing 06/27/2019 1515 by Lawanda Cousins, RN Outcome: Progressing   Problem: Safety: Goal: Ability to remain free from injury will improve 06/27/2019 1516 by Lawanda Cousins, RN Outcome: Progressing 06/27/2019 1515 by Lawanda Cousins, RN Outcome: Progressing   Problem: Skin Integrity: Goal: Risk for impaired skin integrity will decrease 06/27/2019 1516 by Lawanda Cousins, RN Outcome: Progressing 06/27/2019 1515 by Lawanda Cousins, RN Outcome: Progressing   Problem: Education: Goal: Required Educational Video(s) 06/27/2019 1516 by Lawanda Cousins, RN Outcome: Progressing 06/27/2019 1515 by Lawanda Cousins, RN Outcome: Progressing   Problem: Clinical Measurements: Goal: Ability to maintain clinical measurements within normal limits will improve 06/27/2019 1516 by Lawanda Cousins, RN Outcome: Progressing 06/27/2019 1515 by Lawanda Cousins, RN Outcome: Progressing Goal: Postoperative complications will be avoided or minimized 06/27/2019 1516 by Lawanda Cousins, RN Outcome: Progressing 06/27/2019 1515 by Lawanda Cousins, RN Outcome: Progressing   Problem: Skin Integrity: Goal: Demonstration of wound healing without infection will improve 06/27/2019 1516 by Lawanda Cousins, RN Outcome: Progressing 06/27/2019 1515 by Lawanda Cousins, RN Outcome: Progressing

## 2019-06-27 NOTE — Progress Notes (Signed)
Patient seen and examined.  Good vascularity to fingers.  No sensation present, he got all fingers wrong upon testing.  Flicker of thumb movement.   Splint is intact.  Plan for passive motion of the fingers to maintain compliance, splinting of the ulna fracture, will follow intermittently while he is in-house.  Johnny Bridge, MD
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
## 2019-06-27 NOTE — Progress Notes (Signed)
2 Days Post-Op Procedure(s) (LRB): OPEN REDUCTION INTERNAL FIXATION (ORIF) ULNAR FRACTURE (Right) Subjective: Some soreness in chest  Objective: Vital signs in last 24 hours: Temp:  [98.1 F (36.7 C)-99.1 F (37.3 C)] 98.8 F (37.1 C) (12/31 0829) Pulse Rate:  [85-95] 85 (12/31 0829) Cardiac Rhythm: Normal sinus rhythm (12/31 0700) Resp:  [15-25] 15 (12/31 0829) BP: (127-148)/(70-90) 140/80 (12/31 0829) SpO2:  [98 %-100 %] 100 % (12/31 0829) Weight:  [84.7 kg] 84.7 kg (12/31 0500)  Hemodynamic parameters for last 24 hours:    Intake/Output from previous day: 12/30 0701 - 12/31 0700 In: 360 [P.O.:360] Out: 4362 [Urine:3850; Chest Tube:512] Intake/Output this shift: No intake/output data recorded.  General appearance: alert, cooperative and no distress Neurologic: alert Heart: regular rate and rhythm Lungs: clear to auscultation bilaterally Wound: chest wound clean and dry  Lab Results: Recent Labs    06/25/19 0512 06/27/19 0146  WBC 9.8 11.6*  HGB 13.3 12.0*  HCT 38.5* 34.5*  PLT 236 452*   BMET:  Recent Labs    06/25/19 0512 06/27/19 0146  NA 141 139  K 3.7 3.3*  CL 104 103  CO2 25 26  GLUCOSE 108* 108*  BUN 12 10  CREATININE 0.79 0.59*  CALCIUM 8.6* 8.5*    PT/INR: No results for input(s): LABPROT, INR in the last 72 hours. ABG    Component Value Date/Time   PHART 7.423 06/21/2019 0340   HCO3 28.4 (H) 06/21/2019 0340   TCO2 30 06/21/2019 0340   ACIDBASEDEF 4.0 (H) 06/18/2019 0740   O2SAT 99.0 06/21/2019 0340   CBG (last 3)  Recent Labs    06/26/19 2346 06/27/19 0407 06/27/19 0828  GLUCAP 106* 105* 104*    Assessment/Plan: S/P Procedure(s) (LRB): OPEN REDUCTION INTERNAL FIXATION (ORIF) ULNAR FRACTURE (Right) -No air leak, mild to moderate serous drainage- will dc both chest tubes today   LOS: 9 days    Melrose Nakayama 06/27/2019
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
## 2019-06-28 ENCOUNTER — Inpatient Hospital Stay (HOSPITAL_COMMUNITY): Payer: Managed Care, Other (non HMO)

## 2019-06-28 LAB — CBC
HCT: 38.6 % — ABNORMAL LOW (ref 39.0–52.0)
Hemoglobin: 13 g/dL (ref 13.0–17.0)
MCH: 29.7 pg (ref 26.0–34.0)
MCHC: 33.7 g/dL (ref 30.0–36.0)
MCV: 88.3 fL (ref 80.0–100.0)
Platelets: 620 10*3/uL — ABNORMAL HIGH (ref 150–400)
RBC: 4.37 MIL/uL (ref 4.22–5.81)
RDW: 13.3 % (ref 11.5–15.5)
WBC: 15.7 10*3/uL — ABNORMAL HIGH (ref 4.0–10.5)
nRBC: 0 % (ref 0.0–0.2)

## 2019-06-28 MED ORDER — SODIUM CHLORIDE 0.9 % IV BOLUS
500.0000 mL | Freq: Once | INTRAVENOUS | Status: AC
  Administered 2019-06-28: 500 mL via INTRAVENOUS

## 2019-06-28 MED ORDER — METOPROLOL TARTRATE 5 MG/5ML IV SOLN
5.0000 mg | Freq: Once | INTRAVENOUS | Status: AC
  Administered 2019-06-28: 5 mg via INTRAVENOUS
  Filled 2019-06-28: qty 5

## 2019-06-28 MED ORDER — IOHEXOL 350 MG/ML SOLN
100.0000 mL | Freq: Once | INTRAVENOUS | Status: AC | PRN
  Administered 2019-06-28: 100 mL via INTRAVENOUS

## 2019-06-28 NOTE — Progress Notes (Signed)
[id number]: On-call provider paged regarding increase in patient's HR and sustaining. Pt complaining of anxiety around midnight w/ anti-anxiety med given. Ativan admin around 0430 w/ no change. Pt denies SHOB, CP. Received order for CBC and 500 mL NS bolus. Will continue to monitor.

## 2019-06-28 NOTE — Plan of Care (Signed)
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
## 2019-06-28 NOTE — Progress Notes (Signed)
Persistent, asymptomatic tachycardia since early this morning; HR last several days has been consistently upper 80s and has sustained 110s-130s today. Appears sinus. No new chest pain, no clinical evidence of bleeding, BP has remained normal. No LE edema. Will proceed with CTPE.

## 2019-06-29 ENCOUNTER — Inpatient Hospital Stay (HOSPITAL_COMMUNITY): Payer: Managed Care, Other (non HMO)

## 2019-06-29 DIAGNOSIS — I319 Disease of pericardium, unspecified: Secondary | ICD-10-CM

## 2019-06-29 LAB — ECHOCARDIOGRAM COMPLETE
Height: 71 in
Weight: 2987.67 oz

## 2019-06-29 MED ORDER — METHOCARBAMOL 500 MG PO TABS: 1000.0000 mg | ORAL_TABLET | Freq: Three times a day (TID) | ORAL | 1 refills | Status: DC

## 2019-06-29 MED ORDER — OXYCODONE HCL 5 MG/5ML PO SOLN: 5.0000 mg | ORAL | 0 refills | Status: DC | PRN
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
## 2019-06-29 NOTE — Progress Notes (Signed)
Orders received from Dr. Janee Morn. AVS reviewed and instructions to call Monday for appointments and any questions about dressing etc. Sister is picking up Rx's on her way to pick him up. Computer laptop with patient and in possession upon leaving (Nurse, mental health). Various socks and knitted hats brought to him also with him. (not on admission belongings). Reviewed a second time, what to call the provider or 911 for. Gabriel Cirri RN
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
## 2019-06-29 NOTE — Progress Notes (Signed)
  Echocardiogram 2D Echocardiogram has been performed.  Adine Madura 06/29/2019, 2:02 PM
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
## 2019-07-01 ENCOUNTER — Other Ambulatory Visit: Payer: Self-pay | Admitting: *Deleted

## 2019-07-01 ENCOUNTER — Encounter (HOSPITAL_COMMUNITY): Payer: Self-pay | Admitting: Psychiatry

## 2019-07-01 DIAGNOSIS — I313 Pericardial effusion (noninflammatory): Secondary | ICD-10-CM

## 2019-07-01 DIAGNOSIS — I3139 Other pericardial effusion (noninflammatory): Secondary | ICD-10-CM

## 2019-07-03 ENCOUNTER — Other Ambulatory Visit: Payer: Self-pay

## 2019-07-03 ENCOUNTER — Encounter: Payer: Self-pay | Admitting: Occupational Therapy

## 2019-07-03 ENCOUNTER — Ambulatory Visit: Payer: Managed Care, Other (non HMO) | Attending: General Surgery | Admitting: Occupational Therapy

## 2019-07-03 DIAGNOSIS — R209 Unspecified disturbances of skin sensation: Secondary | ICD-10-CM | POA: Insufficient documentation

## 2019-07-03 DIAGNOSIS — M6281 Muscle weakness (generalized): Secondary | ICD-10-CM | POA: Insufficient documentation

## 2019-07-03 DIAGNOSIS — R208 Other disturbances of skin sensation: Secondary | ICD-10-CM

## 2019-07-03 DIAGNOSIS — R29818 Other symptoms and signs involving the nervous system: Secondary | ICD-10-CM | POA: Insufficient documentation
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
## 2019-07-04 ENCOUNTER — Encounter: Payer: Self-pay | Admitting: Physician Assistant

## 2019-07-04 ENCOUNTER — Other Ambulatory Visit: Payer: Self-pay | Admitting: Physician Assistant

## 2019-07-04 ENCOUNTER — Ambulatory Visit (INDEPENDENT_AMBULATORY_CARE_PROVIDER_SITE_OTHER): Payer: Self-pay

## 2019-07-04 ENCOUNTER — Inpatient Hospital Stay (HOSPITAL_COMMUNITY): Payer: Managed Care, Other (non HMO)

## 2019-07-04 ENCOUNTER — Encounter (HOSPITAL_COMMUNITY): Payer: Self-pay

## 2019-07-04 ENCOUNTER — Ambulatory Visit (HOSPITAL_BASED_OUTPATIENT_CLINIC_OR_DEPARTMENT_OTHER): Payer: Managed Care, Other (non HMO)

## 2019-07-04 ENCOUNTER — Inpatient Hospital Stay (HOSPITAL_COMMUNITY)
Admission: AD | Admit: 2019-07-04 | Discharge: 2019-07-08 | DRG: 271 | Disposition: A | Discharge status: Home / Self Care | Payer: Managed Care, Other (non HMO) | Source: Ambulatory Visit | Attending: Cardiothoracic Surgery | Admitting: Cardiothoracic Surgery

## 2019-07-04 VITALS — Temp 97.7°F | Resp 20

## 2019-07-04 DIAGNOSIS — I313 Pericardial effusion (noninflammatory): Secondary | ICD-10-CM | POA: Diagnosis present

## 2019-07-04 DIAGNOSIS — Z20822 Contact with and (suspected) exposure to covid-19: Secondary | ICD-10-CM | POA: Diagnosis present

## 2019-07-04 DIAGNOSIS — J90 Pleural effusion, not elsewhere classified: Secondary | ICD-10-CM | POA: Diagnosis present

## 2019-07-04 DIAGNOSIS — I3139 Other pericardial effusion (noninflammatory): Secondary | ICD-10-CM | POA: Diagnosis present

## 2019-07-04 DIAGNOSIS — J9811 Atelectasis: Secondary | ICD-10-CM | POA: Diagnosis present

## 2019-07-04 DIAGNOSIS — W3400XA Accidental discharge from unspecified firearms or gun, initial encounter: Secondary | ICD-10-CM

## 2019-07-04 DIAGNOSIS — Z9889 Other specified postprocedural states: Secondary | ICD-10-CM | POA: Diagnosis not present

## 2019-07-04 DIAGNOSIS — Z79899 Other long term (current) drug therapy: Secondary | ICD-10-CM

## 2019-07-04 DIAGNOSIS — Z87891 Personal history of nicotine dependence: Secondary | ICD-10-CM

## 2019-07-04 DIAGNOSIS — Z79891 Long term (current) use of opiate analgesic: Secondary | ICD-10-CM

## 2019-07-04 DIAGNOSIS — E8770 Fluid overload, unspecified: Secondary | ICD-10-CM | POA: Diagnosis present

## 2019-07-04 DIAGNOSIS — Z4682 Encounter for fitting and adjustment of non-vascular catheter: Secondary | ICD-10-CM

## 2019-07-04 DIAGNOSIS — Z4802 Encounter for removal of sutures: Secondary | ICD-10-CM

## 2019-07-04 DIAGNOSIS — I319 Disease of pericardium, unspecified: Secondary | ICD-10-CM | POA: Diagnosis not present

## 2019-07-04 DIAGNOSIS — Z09 Encounter for follow-up examination after completed treatment for conditions other than malignant neoplasm: Secondary | ICD-10-CM

## 2019-07-04 LAB — TYPE AND SCREEN
ABO/RH(D): A POS
Antibody Screen: NEGATIVE

## 2019-07-04 LAB — RESPIRATORY PANEL BY RT PCR (FLU A&B, COVID)
Influenza A by PCR: NEGATIVE
Influenza B by PCR: NEGATIVE
SARS Coronavirus 2 by RT PCR: NEGATIVE

## 2019-07-04 LAB — URINALYSIS, ROUTINE W REFLEX MICROSCOPIC
Bacteria, UA: NONE SEEN
Bilirubin Urine: NEGATIVE
Glucose, UA: NEGATIVE mg/dL
Hgb urine dipstick: NEGATIVE
Ketones, ur: NEGATIVE mg/dL
Leukocytes,Ua: NEGATIVE
Nitrite: NEGATIVE
Protein, ur: 100 mg/dL — AB
Specific Gravity, Urine: 1.028 (ref 1.005–1.030)
pH: 6 (ref 5.0–8.0)

## 2019-07-04 LAB — COMPREHENSIVE METABOLIC PANEL
ALT: 79 U/L — ABNORMAL HIGH (ref 0–44)
AST: 47 U/L — ABNORMAL HIGH (ref 15–41)
Albumin: 3.6 g/dL (ref 3.5–5.0)
Alkaline Phosphatase: 270 U/L — ABNORMAL HIGH (ref 38–126)
Anion gap: 12 (ref 5–15)
BUN: 13 mg/dL (ref 6–20)
CO2: 25 mmol/L (ref 22–32)
Calcium: 9.1 mg/dL (ref 8.9–10.3)
Chloride: 96 mmol/L — ABNORMAL LOW (ref 98–111)
Creatinine, Ser: 0.69 mg/dL (ref 0.61–1.24)
GFR calc Af Amer: >60 mL/min (ref >60)
GFR calc non Af Amer: >60 mL/min (ref >60)
Glucose, Bld: 103 mg/dL — ABNORMAL HIGH (ref 70–99)
Potassium: 4.5 mmol/L (ref 3.5–5.1)
Sodium: 133 mmol/L — ABNORMAL LOW (ref 135–145)
Total Bilirubin: 0.8 mg/dL (ref 0.3–1.2)
Total Protein: 8.2 g/dL — ABNORMAL HIGH (ref 6.5–8.1)

## 2019-07-04 LAB — CBC
HCT: 40.3 % (ref 39.0–52.0)
Hemoglobin: 13.5 g/dL (ref 13.0–17.0)
MCH: 29.9 pg (ref 26.0–34.0)
MCHC: 33.5 g/dL (ref 30.0–36.0)
MCV: 89.4 fL (ref 80.0–100.0)
Platelets: 1154 10*3/uL (ref 150–400)
RBC: 4.51 MIL/uL (ref 4.22–5.81)
RDW: 13.6 % (ref 11.5–15.5)
WBC: 11.1 10*3/uL — ABNORMAL HIGH (ref 4.0–10.5)
nRBC: 0 % (ref 0.0–0.2)

## 2019-07-04 LAB — PROTIME-INR
INR: 1.1 (ref 0.8–1.2)
Prothrombin Time: 14.5 seconds (ref 11.4–15.2)

## 2019-07-04 LAB — SURGICAL PCR SCREEN
MRSA, PCR: NEGATIVE
Staphylococcus aureus: NEGATIVE

## 2019-07-04 LAB — ABO/RH: ABO/RH(D): A POS

## 2019-07-04 LAB — APTT: aPTT: 35 seconds (ref 24–36)

## 2019-07-04 MED ORDER — OXYCODONE HCL 5 MG PO TABS
5.0000 mg | ORAL_TABLET | Freq: Four times a day (QID) | ORAL | Status: DC | PRN
  Administered 2019-07-04: 5 mg via ORAL
  Filled 2019-07-04: qty 1

## 2019-07-04 MED ORDER — OXYCODONE HCL 5 MG/5ML PO SOLN
10.0000 mg | ORAL | Status: DC | PRN
  Administered 2019-07-04 – 2019-07-05 (×2): 10 mg via ORAL
  Filled 2019-07-04 (×2): qty 10

## 2019-07-04 MED ORDER — CEFAZOLIN SODIUM-DEXTROSE 2-4 GM/100ML-% IV SOLN
2.0000 g | INTRAVENOUS | Status: AC
  Administered 2019-07-05: 2 g via INTRAVENOUS

## 2019-07-04 NOTE — Progress Notes (Signed)
Bryce Pace presented for pericardial effusion follow up. Effusion was noted to have increased. DOD (Dr. Clifton James) was notified. Dr. Clifton James contacted Dr. Dorris Fetch and advised to have Bryce Pace to be admitted at the hospital tonight. Patient was discharged to the hospital.  Dewitt Hoes, RDCS
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
## 2019-07-04 NOTE — Progress Notes (Addendum)
Patient arrived from home/doctors office to 4e16, patient with healing chest and left upper chest/side incisions, patient with right arm with ace wrap. Patient without symptoms of shortness of breath, cough or fever or knows exposure to COVID per patient report. Vital signs obtained and monitor placed. Will monitor patient and  Will swab for COVID as per protocol. Bryce Pace, Randall An RN
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
## 2019-07-05 ENCOUNTER — Inpatient Hospital Stay (HOSPITAL_COMMUNITY): Payer: Managed Care, Other (non HMO) | Admitting: Certified Registered Nurse Anesthetist

## 2019-07-05 ENCOUNTER — Encounter (HOSPITAL_COMMUNITY)
Admission: AD | Disposition: A | Discharge status: Home / Self Care | Payer: Self-pay | Source: Ambulatory Visit | Attending: Cardiothoracic Surgery

## 2019-07-05 ENCOUNTER — Other Ambulatory Visit (HOSPITAL_COMMUNITY): Payer: Managed Care, Other (non HMO)

## 2019-07-05 ENCOUNTER — Inpatient Hospital Stay (HOSPITAL_COMMUNITY): Payer: Managed Care, Other (non HMO)

## 2019-07-05 ENCOUNTER — Inpatient Hospital Stay (HOSPITAL_COMMUNITY)
Admission: RE | Admit: 2019-07-05 | Payer: Managed Care, Other (non HMO) | Source: Home / Self Care | Admitting: Cardiothoracic Surgery

## 2019-07-05 DIAGNOSIS — Z9889 Other specified postprocedural states: Secondary | ICD-10-CM | POA: Diagnosis present

## 2019-07-05 DIAGNOSIS — I319 Disease of pericardium, unspecified: Secondary | ICD-10-CM

## 2019-07-05 HISTORY — PX: TEE WITHOUT CARDIOVERSION: SHX5443

## 2019-07-05 HISTORY — PX: SUBXYPHOID PERICARDIAL WINDOW: SHX5075

## 2019-07-05 HISTORY — PX: PERICARDIAL FLUID DRAINAGE: SHX5100

## 2019-07-05 LAB — ECHO INTRAOPERATIVE TEE

## 2019-07-05 SURGERY — CREATION, PERICARDIAL WINDOW, SUBXIPHOID APPROACH
Anesthesia: General | Site: Chest

## 2019-07-05 MED ORDER — ROCURONIUM BROMIDE 10 MG/ML (PF) SYRINGE
PREFILLED_SYRINGE | INTRAVENOUS | Status: DC | PRN
  Administered 2019-07-05: 20 mg via INTRAVENOUS
  Administered 2019-07-05: 50 mg via INTRAVENOUS
  Administered 2019-07-05: 20 mg via INTRAVENOUS
  Administered 2019-07-05: 30 mg via INTRAVENOUS

## 2019-07-05 MED ORDER — 0.9 % SODIUM CHLORIDE (POUR BTL) OPTIME
TOPICAL | Status: DC | PRN
  Administered 2019-07-05: 2000 mL

## 2019-07-05 MED ORDER — BISACODYL 5 MG PO TBEC
10.0000 mg | DELAYED_RELEASE_TABLET | Freq: Every day | ORAL | Status: DC
  Administered 2019-07-06 – 2019-07-07 (×2): 10 mg via ORAL
  Filled 2019-07-05 (×3): qty 2

## 2019-07-05 MED ORDER — ONDANSETRON HCL 4 MG/2ML IJ SOLN: 4.0000 mg | Freq: Four times a day (QID) | INTRAMUSCULAR | Status: DC | PRN

## 2019-07-05 MED ORDER — PROPOFOL 10 MG/ML IV BOLUS
INTRAVENOUS | Status: AC
  Filled 2019-07-05: qty 40

## 2019-07-05 MED ORDER — ACETAMINOPHEN 160 MG/5ML PO SOLN
1000.0000 mg | Freq: Four times a day (QID) | ORAL | Status: DC
  Administered 2019-07-07: 1000 mg via ORAL
  Filled 2019-07-05: qty 40.6

## 2019-07-05 MED ORDER — LACTATED RINGERS IV SOLN: INTRAVENOUS | Status: DC | PRN

## 2019-07-05 MED ORDER — MIDAZOLAM HCL 2 MG/2ML IJ SOLN
INTRAMUSCULAR | Status: AC
  Filled 2019-07-05: qty 4

## 2019-07-05 MED ORDER — FENTANYL CITRATE (PF) 100 MCG/2ML IJ SOLN
25.0000 ug | INTRAMUSCULAR | Status: DC | PRN
  Administered 2019-07-05: 25 ug via INTRAVENOUS

## 2019-07-05 MED ORDER — ROCURONIUM BROMIDE 10 MG/ML (PF) SYRINGE
PREFILLED_SYRINGE | INTRAVENOUS | Status: AC
  Filled 2019-07-05: qty 20

## 2019-07-05 MED ORDER — MIDAZOLAM HCL 2 MG/2ML IJ SOLN
INTRAMUSCULAR | Status: DC | PRN
  Administered 2019-07-05: 4 mg via INTRAVENOUS

## 2019-07-05 MED ORDER — CHLORHEXIDINE GLUCONATE CLOTH 2 % EX PADS
6.0000 | MEDICATED_PAD | Freq: Every day | CUTANEOUS | Status: DC
  Administered 2019-07-06 – 2019-07-07 (×2): 6 via TOPICAL

## 2019-07-05 MED ORDER — DEXAMETHASONE SODIUM PHOSPHATE 10 MG/ML IJ SOLN
INTRAMUSCULAR | Status: DC | PRN
  Administered 2019-07-05: 4 mg via INTRAVENOUS

## 2019-07-05 MED ORDER — OXYCODONE HCL 5 MG PO TABS: 5.0000 mg | ORAL_TABLET | Freq: Once | ORAL | Status: DC | PRN

## 2019-07-05 MED ORDER — FENTANYL CITRATE (PF) 250 MCG/5ML IJ SOLN
INTRAMUSCULAR | Status: AC
  Filled 2019-07-05: qty 5

## 2019-07-05 MED ORDER — ONDANSETRON HCL 4 MG/2ML IJ SOLN: 4.0000 mg | Freq: Once | INTRAMUSCULAR | Status: DC | PRN

## 2019-07-05 MED ORDER — FENTANYL CITRATE (PF) 100 MCG/2ML IJ SOLN
INTRAMUSCULAR | Status: DC | PRN
  Administered 2019-07-05: 100 ug via INTRAVENOUS
  Administered 2019-07-05: 50 ug via INTRAVENOUS
  Administered 2019-07-05: 100 ug via INTRAVENOUS

## 2019-07-05 MED ORDER — OXYCODONE HCL 5 MG/5ML PO SOLN: 5.0000 mg | Freq: Once | ORAL | Status: DC | PRN

## 2019-07-05 MED ORDER — ACETAMINOPHEN 500 MG PO TABS
1000.0000 mg | ORAL_TABLET | Freq: Four times a day (QID) | ORAL | Status: DC
  Administered 2019-07-05 – 2019-07-08 (×8): 1000 mg via ORAL
  Filled 2019-07-05 (×9): qty 2

## 2019-07-05 MED ORDER — DEXAMETHASONE SODIUM PHOSPHATE 10 MG/ML IJ SOLN
INTRAMUSCULAR | Status: AC
  Filled 2019-07-05: qty 1

## 2019-07-05 MED ORDER — OXYCODONE HCL 5 MG/5ML PO SOLN
10.0000 mg | Freq: Four times a day (QID) | ORAL | Status: DC | PRN
  Administered 2019-07-05 – 2019-07-08 (×8): 10 mg via ORAL
  Filled 2019-07-05 (×8): qty 10

## 2019-07-05 MED ORDER — ONDANSETRON HCL 4 MG/2ML IJ SOLN
INTRAMUSCULAR | Status: DC | PRN
  Administered 2019-07-05: 4 mg via INTRAVENOUS

## 2019-07-05 MED ORDER — CEFAZOLIN SODIUM-DEXTROSE 2-4 GM/100ML-% IV SOLN
2.0000 g | Freq: Three times a day (TID) | INTRAVENOUS | Status: AC
  Administered 2019-07-05 (×2): 2 g via INTRAVENOUS
  Filled 2019-07-05 (×2): qty 100

## 2019-07-05 MED ORDER — ONDANSETRON HCL 4 MG/2ML IJ SOLN
INTRAMUSCULAR | Status: AC
  Filled 2019-07-05: qty 2

## 2019-07-05 MED ORDER — PROPOFOL 10 MG/ML IV BOLUS
INTRAVENOUS | Status: DC | PRN
  Administered 2019-07-05: 120 mg via INTRAVENOUS

## 2019-07-05 MED ORDER — SUGAMMADEX SODIUM 200 MG/2ML IV SOLN
INTRAVENOUS | Status: DC | PRN
  Administered 2019-07-05: 380 mg via INTRAVENOUS

## 2019-07-05 MED ORDER — SENNOSIDES-DOCUSATE SODIUM 8.6-50 MG PO TABS
1.0000 | ORAL_TABLET | Freq: Every day | ORAL | Status: DC
  Filled 2019-07-05: qty 1

## 2019-07-05 MED ORDER — LIDOCAINE 2% (20 MG/ML) 5 ML SYRINGE
INTRAMUSCULAR | Status: DC | PRN
  Administered 2019-07-05: 60 mg via INTRAVENOUS

## 2019-07-05 MED ORDER — SODIUM CHLORIDE 0.9 % IV SOLN: INTRAVENOUS | Status: DC | PRN

## 2019-07-05 MED ORDER — FENTANYL CITRATE (PF) 100 MCG/2ML IJ SOLN
INTRAMUSCULAR | Status: AC
  Filled 2019-07-05: qty 2

## 2019-07-05 MED ORDER — STERILE WATER FOR IRRIGATION IR SOLN
Status: DC | PRN
  Administered 2019-07-05: 1000 mL

## 2019-07-05 MED ORDER — PHENYLEPHRINE HCL-NACL 10-0.9 MG/250ML-% IV SOLN
INTRAVENOUS | Status: DC | PRN
  Administered 2019-07-05: 40 ug/min via INTRAVENOUS

## 2019-07-05 MED ORDER — PHENYLEPHRINE 40 MCG/ML (10ML) SYRINGE FOR IV PUSH (FOR BLOOD PRESSURE SUPPORT)
PREFILLED_SYRINGE | INTRAVENOUS | Status: DC | PRN
  Administered 2019-07-05: 120 ug via INTRAVENOUS

## 2019-07-05 SURGICAL SUPPLY — 56 items
BLADE CLIPPER SURG (BLADE) IMPLANT
BNDG ELASTIC 4X5.8 VLCR STR LF (GAUZE/BANDAGES/DRESSINGS) ×3 IMPLANT
CANISTER SUCT 3000ML PPV (MISCELLANEOUS) ×3 IMPLANT
CATH ROBINSON RED A/P 18FR (CATHETERS) ×6 IMPLANT
CATH THORACIC 28FR (CATHETERS) IMPLANT
CATH THORACIC 28FR RT ANG (CATHETERS) IMPLANT
CONN ST 1/4X3/8  BEN (MISCELLANEOUS) ×1
CONN ST 1/4X3/8 BEN (MISCELLANEOUS) ×2 IMPLANT
CONT SPEC 4OZ CLIKSEAL STRL BL (MISCELLANEOUS) ×3 IMPLANT
DERMABOND ADVANCED (GAUZE/BANDAGES/DRESSINGS) ×1
DERMABOND ADVANCED .7 DNX12 (GAUZE/BANDAGES/DRESSINGS) ×2 IMPLANT
DRAIN CHANNEL 28F RND 3/8 FF (WOUND CARE) ×3 IMPLANT
DRAPE LAPAROSCOPIC ABDOMINAL (DRAPES) ×3 IMPLANT
DRSG AQUACEL AG ADV 3.5X 6 (GAUZE/BANDAGES/DRESSINGS) ×3 IMPLANT
DRSG EMULSION OIL 3X3 NADH (GAUZE/BANDAGES/DRESSINGS) ×3 IMPLANT
ELECT BLADE 4.0 EZ CLEAN MEGAD (MISCELLANEOUS) ×3
ELECT REM PT RETURN 9FT ADLT (ELECTROSURGICAL) ×3
ELECTRODE BLDE 4.0 EZ CLN MEGD (MISCELLANEOUS) ×2 IMPLANT
ELECTRODE REM PT RTRN 9FT ADLT (ELECTROSURGICAL) ×2 IMPLANT
GAUZE SPONGE 4X4 12PLY STRL (GAUZE/BANDAGES/DRESSINGS) IMPLANT
GAUZE SPONGE 4X4 12PLY STRL LF (GAUZE/BANDAGES/DRESSINGS) ×3 IMPLANT
GLOVE BIO SURGEON STRL SZ 6.5 (GLOVE) ×9 IMPLANT
GLOVE BIO SURGEON STRL SZ7.5 (GLOVE) ×3 IMPLANT
GLOVE BIOGEL PI IND STRL 6 (GLOVE) ×2 IMPLANT
GLOVE BIOGEL PI IND STRL 6.5 (GLOVE) ×2 IMPLANT
GLOVE BIOGEL PI INDICATOR 6 (GLOVE) ×1
GLOVE BIOGEL PI INDICATOR 6.5 (GLOVE) ×1
HEMOSTAT POWDER SURGIFOAM 1G (HEMOSTASIS) IMPLANT
KIT BASIN OR (CUSTOM PROCEDURE TRAY) ×3 IMPLANT
KIT REMOVER STAPLE SKIN (MISCELLANEOUS) ×3 IMPLANT
KIT TURNOVER KIT B (KITS) ×3 IMPLANT
NS IRRIG 1000ML POUR BTL (IV SOLUTION) ×6 IMPLANT
PACK CHEST (CUSTOM PROCEDURE TRAY) ×3 IMPLANT
PAD ARMBOARD 7.5X6 YLW CONV (MISCELLANEOUS) ×6 IMPLANT
PAD CAST 4YDX4 CTTN HI CHSV (CAST SUPPLIES) ×2 IMPLANT
PAD ELECT DEFIB RADIOL ZOLL (MISCELLANEOUS) ×3 IMPLANT
PADDING CAST COTTON 4X4 STRL (CAST SUPPLIES) ×1
PADDING UNDERCAST 2 STRL (CAST SUPPLIES) ×1
PADDING UNDERCAST 2X4 STRL (CAST SUPPLIES) ×2 IMPLANT
SUT SILK  1 MH (SUTURE) ×1
SUT SILK 1 MH (SUTURE) ×2 IMPLANT
SUT VIC AB 1 CTX 18 (SUTURE) ×9 IMPLANT
SUT VIC AB 2-0 CTX 27 (SUTURE) ×6 IMPLANT
SUT VIC AB 3-0 SH 8-18 (SUTURE) ×3 IMPLANT
SUT VIC AB 3-0 X1 27 (SUTURE) ×6 IMPLANT
SWAB COLLECTION DEVICE MRSA (MISCELLANEOUS) IMPLANT
SWAB CULTURE ESWAB REG 1ML (MISCELLANEOUS) IMPLANT
SYR 10ML LL (SYRINGE) IMPLANT
SYR 50ML SLIP (SYRINGE) IMPLANT
SYSTEM SAHARA CHEST DRAIN ATS (WOUND CARE) ×3 IMPLANT
TAPE CLOTH SURG 4X10 WHT LF (GAUZE/BANDAGES/DRESSINGS) ×3 IMPLANT
TOWEL GREEN STERILE (TOWEL DISPOSABLE) ×3 IMPLANT
TOWEL GREEN STERILE FF (TOWEL DISPOSABLE) ×3 IMPLANT
TRAP SPECIMEN MUCOUS 40CC (MISCELLANEOUS) ×6 IMPLANT
TRAY FOLEY SLVR 16FR TEMP STAT (SET/KITS/TRAYS/PACK) IMPLANT
WATER STERILE IRR 1000ML POUR (IV SOLUTION) ×3 IMPLANT

## 2019-07-05 NOTE — Progress Notes (Signed)
  Echocardiogram Echocardiogram Transesophageal has been performed.  Bryce Pace 07/05/2019, 8:32 AM
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
## 2019-07-05 NOTE — Anesthesia Postprocedure Evaluation (Signed)
Anesthesia Post Note  Patient: Corwyn Vora  Procedure(s) Performed: SUBXYPHOID PERICARDIAL WINDOW (N/A Chest) TRANSESOPHAGEAL ECHOCARDIOGRAM (TEE) (N/A ) Drainage Of Pericardial Fluid (N/A Chest)     Patient location during evaluation: PACU Anesthesia Type: General Level of consciousness: awake and alert Pain management: pain level controlled Vital Signs Assessment: post-procedure vital signs reviewed and stable Respiratory status: spontaneous breathing, nonlabored ventilation, respiratory function stable and patient connected to nasal cannula oxygen Cardiovascular status: blood pressure returned to baseline and stable Postop Assessment: no apparent nausea or vomiting Anesthetic complications: no    Last Vitals:  Vitals:   07/05/19 1015 07/05/19 1030  BP: 136/82 136/86  Pulse: (!) 101 99  Resp: 17 17  Temp:    SpO2: 95% 95%    Last Pain:  Vitals:   07/05/19 1012  TempSrc:   PainSc: 8                  Ranesha Val COKER

## 2019-07-05 NOTE — Brief Op Note (Addendum)
07/05/2019  9:47 AM  PATIENT:  Bryce Pace  24 y.o. male  PRE-OPERATIVE DIAGNOSIS:  pericardial effusion  POST-OPERATIVE DIAGNOSIS:  pericardial effusion  PROCEDURE:  Procedure(s): SUBXYPHOID PERICARDIAL WINDOW (N/A) TRANSESOPHAGEAL ECHOCARDIOGRAM (TEE) (N/A) Drainage Of Pericardial Fluid (N/A)  SURGEON:  Surgeon(s) and Role:    * Delight Ovens, MD - Primary  PHYSICIAN ASSISTANT: WAYNE GOLD PA-C  ANESTHESIA:   general  EBL:  MINIMAL  BLOOD ADMINISTERED:none  DRAINS: (1 28 F ) Blake drain(s) in the PERICARDIUM   LOCAL MEDICATIONS USED:  NONE  SPECIMEN:  Source of Specimen:  PERICARDIAL FLUID  DISPOSITION OF SPECIMEN:  MICRO  COUNTS:  YES  DICTATION: .Other Dictation: Dictation Number [id number]  PLAN OF CARE: Admit to inpatient   PATIENT DISPOSITION:  PACU - hemodynamically stable.   Delay start of Pharmacological VTE agent (>24hrs) due to surgical blood loss or risk of bleeding: yes
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
## 2019-07-05 NOTE — Transfer of Care (Signed)
Immediate Anesthesia Transfer of Care Note  Patient: Kayle Correa  Procedure(s) Performed: SUBXYPHOID PERICARDIAL WINDOW (N/A Chest) TRANSESOPHAGEAL ECHOCARDIOGRAM (TEE) (N/A ) Drainage Of Pericardial Fluid (N/A Chest)  Patient Location: PACU  Anesthesia Type:General  Level of Consciousness: awake, alert , oriented and patient cooperative  Airway & Oxygen Therapy: Patient Spontanous Breathing and Patient connected to nasal cannula oxygen  Post-op Assessment: Report given to RN, Post -op Vital signs reviewed and stable, Patient moving all extremities and baseline weakness RUE   Post vital signs: Reviewed and stable  Last Vitals:  Vitals Value Taken Time  BP 144/83 07/05/19 0944  Temp    Pulse 105 07/05/19 0953  Resp 17 07/05/19 0953  SpO2 95 % 07/05/19 0953  Vitals shown include unvalidated device data.  Last Pain:  Vitals:   07/05/19 0539  TempSrc:   PainSc: Asleep         Complications: No apparent anesthesia complications

## 2019-07-05 NOTE — Anesthesia Procedure Notes (Addendum)
Arterial Line Insertion Start/End1/01/2020 7:10 AM, 07/05/2019 7:25 AM Performed by: Kipp Brood, MD, anesthesiologist  Patient location: Pre-op. Preanesthetic checklist: patient identified, IV checked, site marked, risks and benefits discussed, surgical consent, monitors and equipment checked, pre-op evaluation, timeout performed and anesthesia consent Lidocaine 1% used for infiltration and patient sedated Left, brachial was placed Catheter size: 20 G Hand hygiene performed  and maximum sterile barriers used  Allen's test indicative of satisfactory collateral circulation Attempts: 1 (2 attempts by CRNA L radial unsuccessful) Procedure performed without using ultrasound guided technique. Following insertion, dressing applied and Biopatch. Post procedure assessment: normal  Patient tolerated the procedure well with no immediate complications.

## 2019-07-05 NOTE — Progress Notes (Signed)
      [address]       [place] Pace [address]             [phone]    Pre Procedure note for inpatients:   Bryce Pace has been scheduled for Procedure(s): SUBXYPHOID PERICARDIAL WINDOW (N/A) TRANSESOPHAGEAL ECHOCARDIOGRAM (TEE) (N/A) today. The various methods of treatment have been discussed with the patient. After consideration of the risks, benefits and treatment options the patient has consented to the planned procedure.   The patient has been seen and labs reviewed. There are no changes in the patient's condition to prevent proceeding with the planned procedure today.  Recent labs:  Lab Results  Component Value Date   WBC 11.1 (H) 07/04/2019   HGB 13.5 07/04/2019   HCT 40.3 07/04/2019   PLT 1,154 (HH) 07/04/2019   GLUCOSE 103 (H) 07/04/2019   ALT 79 (H) 07/04/2019   AST 47 (H) 07/04/2019   NA 133 (L) 07/04/2019   K 4.5 07/04/2019   CL 96 (L) 07/04/2019   CREATININE 0.69 07/04/2019   BUN 13 07/04/2019   CO2 25 07/04/2019   INR 1.1 07/04/2019    Bryce Ovens, MD 07/05/2019 7:21 AM

## 2019-07-05 NOTE — Discharge Instructions (Signed)
Conn's Current Therapy 2017 (pp. 79-158). [place], [place]: Elsevier, [employer]."> Stoelting's Anesthesia and Co-Existing Disease (7th ed., pp. 225-236). [place]: [employer]."> Braunwald's Heart Disease (11th ed., pp. 174-243). [place]: Elsevier.">  Pericardial Effusion  Pericardial effusion is a buildup of fluid around the heart. The heart is surrounded by a thin, double-layered sac (pericardium). When fluid builds up in this sac, it can put too much pressure on the heart and make it harder for the heart to pump blood (cardiac tamponade). This can be life-threatening. What are the causes? Often, the cause of pericardial effusion is not known (idiopathic effusion). In some cases, the condition may be caused by:  Infections from a virus, fungus, parasite, or bacteria.  Damage to the pericardium from heart surgery or a heart attack.  Inflammatory diseases, such as rheumatoid arthritis or lupus.  Kidney or thyroid disease.  Cancer or treatment for cancer, including radiation or chemotherapy.  Certain medicines, including medicines for tuberculosis or seizures.  Chest injury. What are the signs or symptoms? Pericardial effusion may not cause symptoms at first, especially if the fluid builds up slowly. In time, pressure on the heart may cause:  Chest pain and trouble breathing. This may include pain and shortness of breath that get worse when lying down.  Dizziness and fainting.  Coughing and hiccups.  Skipped heartbeats.  Anxiety and confusion.  A bluish skin color (cyanosis).  Swollen legs and ankles.  A feeling of fullness in the chest. How is this diagnosed? This condition is diagnosed based on your symptoms and testing, which may include:  A test that creates ultrasound images of your heart (echocardiogram).  A test to examine the electrical functions of your heart (electrocardiogram).  Chest X-ray.  CT scan.  MRI.  Blood tests. How is this  treated? Treatment for this condition depends on the cause of your condition and how severe your symptoms are. Treatment may include:  Medicines, such as: ? NSAIDs, such as ibuprofen. ? Anti-inflammatory medicines, such as steroids. ? Medicines to fight infection, such as antibiotics.  Hospital treatment. This may be necessary if the fluid around the heart prevents it from pumping enough blood. Treatment in the hospital may include: ? IV fluids. ? Breathing support.  Surgery. This may be needed in severe cases. Surgery may include: ? A procedure to remove fluid from the pericardium by placing a needle into it (pericardiocentesis). ? A procedure to make a permanent opening in the pericardium (pericardial window). ? Open heart surgery. Follow these instructions at home:  Take over-the-counter and prescription medicines only as told by your health care provider.  If you were prescribed a medicine to fight infection, such as antibiotic medicine, take it as told by your health care provider. Do not stop taking the medicine even if you start to feel better.  Rest as told by your health care provider. Ask your health care provider what activities are safe for you.  Keep all follow-up visits as told by your health care provider. This is important. Contact a health care provider if:  You have a cough or hiccups that do not go away.  You have severe swelling in your legs or ankles. Get help right away if you:  Have fast or irregular heartbeats (palpitations).  Feel dizzy or light-headed.  Faint.  Have chest pain.  Have trouble breathing. These symptoms may represent a serious problem that is an emergency. Do not wait to see if the symptoms will go away. Get medical help right   away. Call your local emergency services (911 in the [place] Do not drive yourself to the hospital. Summary  Pericardial effusion is a buildup of fluid around the heart. The fluid can eventually prevent the  heart from pumping enough blood (cardiac tamponade), which can be life-threatening.  Pericardial effusion may not cause symptoms at first.  Treatment for pericardial effusion depends on the cause of your condition and how severe your symptoms are. In severe cases, hospital treatment or surgery may be required.  Rest as told by your health care provider. Ask your health care provider what activities are safe for you. This information is not intended to replace advice given to you by your health care provider. Make sure you discuss any questions you have with your health care provider. Document Revised: 10/30/2018 Document Reviewed: 10/30/2018 Elsevier Patient Education  2020 [employer].  Discharge Instructions:  1. You may shower, please wash incisions daily with soap and water and keep dry.  If you wish to cover wounds with dressing you may do so but please keep clean and change daily.  No tub baths or swimming until incisions have completely healed.  If your incisions become red or develop any drainage please call our office at [phone]  2. No Driving until cleared by Dr. Ysidro Evert' office and you are no longer using narcotic pain medications  3. Monitor your weight daily.. Please use the same scale and weigh at same time... If you gain 3-5 lbs in 48 hours with associated lower extremity swelling, please contact our office at [phone]  4. Fever of 101.5 for at least 24 hours with no source, please contact our office at [phone]  5. Activity- up as tolerated, please walk at least 3 times per day.  Avoid strenuous activity, no lifting, pushing, or pulling with your arms over 8-10 lbs for a minimum of 6 weeks  6. If any questions or concerns arise, please do not hesitate to contact our office at [phone]

## 2019-07-05 NOTE — Addendum Note (Signed)
Addendum  created 07/05/19 1042 by Adair Laundry, CRNA   Order list changed
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
## 2019-07-05 NOTE — Anesthesia Procedure Notes (Signed)
Procedure Name: Intubation Date/Time: 07/05/2019 7:59 AM Performed by: Leonor Liv, CRNA Pre-anesthesia Checklist: Patient identified, Emergency Drugs available, Suction available and Patient being monitored Patient Re-evaluated:Patient Re-evaluated prior to induction Oxygen Delivery Method: Circle System Utilized Preoxygenation: Pre-oxygenation with 100% oxygen Induction Type: IV induction Ventilation: Mask ventilation without difficulty Laryngoscope Size: Mac and 4 Grade View: Grade II Tube type: Oral Tube size: 8.0 mm Number of attempts: 1 Airway Equipment and Method: Stylet and Oral airway Placement Confirmation: ETT inserted through vocal cords under direct vision,  positive ETCO2 and breath sounds checked- equal and bilateral Secured at: 23 cm Tube secured with: Tape Dental Injury: Teeth and Oropharynx as per pre-operative assessment

## 2019-07-05 NOTE — Progress Notes (Signed)
Removed right upper arm staples and nylons in the right forearm while patient was asleep in the OR for pericardial window with Dr. Tyrone Sage.  Patient had repair of right right radial artery on 12/22 with Dr. Edilia Bo.  He was very tachycardic prior to pericardial window and could not appreciate a radial pulse but he had radial and ulnar signals with no signs of distal ischemia or ulcerations.  We can confirm with arterial duplex to ensure his bypass graft remains patent this weekend.  He has several wounds on his forearm and upper arm that we asked the OR to cover with Xeroform and sterile dressings.  Cephus Shelling, MD [facility] Office: [phone] Pager: [phone]  Cephus Shelling

## 2019-07-05 NOTE — Anesthesia Procedure Notes (Signed)
Central Venous Catheter Insertion Performed by: Kipp Brood, MD, anesthesiologist Start/End1/01/2020 7:00 AM, 07/05/2019 7:10 AM Patient location: Pre-op. Preanesthetic checklist: patient identified, IV checked, site marked, risks and benefits discussed, surgical consent, monitors and equipment checked, pre-op evaluation, timeout performed and anesthesia consent Lidocaine 1% used for infiltration and patient sedated Hand hygiene performed  and maximum sterile barriers used  Catheter size: 8 Fr Total catheter length 16. Central line was placed.Double lumen Procedure performed using ultrasound guided technique. Ultrasound Notes:anatomy identified, needle tip was noted to be adjacent to the nerve/plexus identified, no ultrasound evidence of intravascular and/or intraneural injection and image(s) printed for medical record Attempts: 1 Following insertion, dressing applied and line sutured. Post procedure assessment: blood return through all ports  Patient tolerated the procedure well with no immediate complications.

## 2019-07-06 ENCOUNTER — Inpatient Hospital Stay (HOSPITAL_COMMUNITY): Payer: Managed Care, Other (non HMO)

## 2019-07-06 DIAGNOSIS — Z9889 Other specified postprocedural states: Secondary | ICD-10-CM

## 2019-07-06 LAB — CBC
HCT: 34.6 % — ABNORMAL LOW (ref 39.0–52.0)
Hemoglobin: 11.5 g/dL — ABNORMAL LOW (ref 13.0–17.0)
MCH: 29.6 pg (ref 26.0–34.0)
MCHC: 33.2 g/dL (ref 30.0–36.0)
MCV: 89.2 fL (ref 80.0–100.0)
Platelets: 950 10*3/uL (ref 150–400)
RBC: 3.88 MIL/uL — ABNORMAL LOW (ref 4.22–5.81)
RDW: 13.4 % (ref 11.5–15.5)
WBC: 11.1 10*3/uL — ABNORMAL HIGH (ref 4.0–10.5)
nRBC: 0 % (ref 0.0–0.2)

## 2019-07-06 LAB — BASIC METABOLIC PANEL
Anion gap: 12 (ref 5–15)
BUN: 9 mg/dL (ref 6–20)
CO2: 26 mmol/L (ref 22–32)
Calcium: 8.9 mg/dL (ref 8.9–10.3)
Chloride: 97 mmol/L — ABNORMAL LOW (ref 98–111)
Creatinine, Ser: 0.74 mg/dL (ref 0.61–1.24)
GFR calc Af Amer: >60 mL/min (ref >60)
GFR calc non Af Amer: >60 mL/min (ref >60)
Glucose, Bld: 108 mg/dL — ABNORMAL HIGH (ref 70–99)
Potassium: 3.8 mmol/L (ref 3.5–5.1)
Sodium: 135 mmol/L (ref 135–145)

## 2019-07-06 LAB — BLOOD GAS, ARTERIAL
Acid-Base Excess: 2.9 mmol/L — ABNORMAL HIGH (ref 0.0–2.0)
Bicarbonate: 26.9 mmol/L (ref 20.0–28.0)
FIO2: 21
O2 Saturation: 97 %
Patient temperature: 37.1
pCO2 arterial: 41.5 mmHg (ref 32.0–48.0)
pH, Arterial: 7.428 (ref 7.350–7.450)
pO2, Arterial: 90.4 mmHg (ref 83.0–108.0)

## 2019-07-06 LAB — PATHOLOGIST SMEAR REVIEW

## 2019-07-06 MED ORDER — POTASSIUM CHLORIDE CRYS ER 20 MEQ PO TBCR
30.0000 meq | EXTENDED_RELEASE_TABLET | Freq: Once | ORAL | Status: AC
  Administered 2019-07-06: 30 meq via ORAL
  Filled 2019-07-06: qty 1

## 2019-07-06 NOTE — Progress Notes (Signed)
VASCULAR LAB PRELIMINARY  PRELIMINARY  PRELIMINARY  PRELIMINARY  Right upper extremity arterial duplex completed.    Preliminary report:  See CV proc for preliminary results.   Sergei Delo, RVT 07/06/2019, 1:36 PM

## 2019-07-06 NOTE — Progress Notes (Addendum)
      [address]       [name] [address]             [phone]       1 Day Post-Op Procedure(s) (LRB): SUBXYPHOID PERICARDIAL WINDOW (N/A) TRANSESOPHAGEAL ECHOCARDIOGRAM (TEE) (N/A) Drainage Of Pericardial Fluid (N/A)  Subjective: Patient asking for right IJ catheter to be removed.  Objective: Vital signs in last 24 hours: Temp:  [98 F (36.7 C)-98.9 F (37.2 C)] 98.4 F (36.9 C) (01/09 0746) Pulse Rate:  [53-115] 98 (01/09 0746) Cardiac Rhythm: Normal sinus rhythm (01/09 0700) Resp:  [11-29] 17 (01/09 0746) BP: (129-162)/(82-98) 152/92 (01/09 0746) SpO2:  [95 %-100 %] 98 % (01/09 0746) Arterial Line BP: (90-143)/(74-86) 90/86 (01/09 0746)     Intake/Output from previous day: 01/08 0701 - 01/09 0700 In: 1307.2 [P.O.:240; I.V.:1000; IV Piggyback:67.2] Out: 1435 [Urine:925; Blood:5; Chest Tube:130]   Physical Exam:  Cardiovascular: RRR Pulmonary: Clear to auscultation bilaterally Abdomen: Soft, non tender, bowel sounds present. Extremities: Nol lower extremity edema. Wounds: Dressing intact Pericardial Tube: so suction  Lab Results: CBC: Recent Labs    07/04/19 1838 07/06/19 0400  WBC 11.1* 11.1*  HGB 13.5 11.5*  HCT 40.3 34.6*  PLT 1,154* 950*   BMET:  Recent Labs    07/04/19 1838 07/06/19 0400  NA 133* 135  K 4.5 3.8  CL 96* 97*  CO2 25 26  GLUCOSE 103* 108*  BUN 13 9  CREATININE 0.69 0.74  CALCIUM 9.1 8.9    PT/INR:  Recent Labs    07/04/19 1838  LABPROT 14.5  INR 1.1   ABG:  INR: Will add last result for INR, ABG once components are confirmed Will add last 4 CBG results once components are confirmed  Assessment/Plan:  1. CV - ST at times. Pericardial drain with 130 cc since surgery. Will remain for today. Await culture 2.  Pulmonary - on room air. Check CXR in am. Encourage incentive spirometer 3. Supplement potassium 4. Anemia-H and H this am decreased to 11.5 and 34.6 5. I had a discussion with patient  that am labs are ordered and if catheter remove, he will have to be stuck for labs. I asked nurse to tape to neck better 6. Remove a line Donielle M ZimmermanPA-C 07/06/2019,9:26 AM   Agree with above CT output decreasing Feels much better Will keep drain for a few days  Bayne Fosnaugh Keane Scrape

## 2019-07-06 NOTE — Plan of Care (Signed)

## 2019-07-07 ENCOUNTER — Encounter (HOSPITAL_COMMUNITY): Payer: Self-pay | Admitting: Cardiothoracic Surgery

## 2019-07-07 ENCOUNTER — Other Ambulatory Visit: Payer: Self-pay

## 2019-07-07 ENCOUNTER — Inpatient Hospital Stay (HOSPITAL_COMMUNITY): Payer: Managed Care, Other (non HMO)

## 2019-07-07 LAB — CBC
HCT: 33.7 % — ABNORMAL LOW (ref 39.0–52.0)
Hemoglobin: 11.1 g/dL — ABNORMAL LOW (ref 13.0–17.0)
MCH: 30.1 pg (ref 26.0–34.0)
MCHC: 32.9 g/dL (ref 30.0–36.0)
MCV: 91.3 fL (ref 80.0–100.0)
Platelets: 821 10*3/uL — ABNORMAL HIGH (ref 150–400)
RBC: 3.69 MIL/uL — ABNORMAL LOW (ref 4.22–5.81)
RDW: 13.3 % (ref 11.5–15.5)
WBC: 9.9 10*3/uL (ref 4.0–10.5)
nRBC: 0 % (ref 0.0–0.2)

## 2019-07-07 LAB — COMPREHENSIVE METABOLIC PANEL
ALT: 48 U/L — ABNORMAL HIGH (ref 0–44)
AST: 28 U/L (ref 15–41)
Albumin: 2.5 g/dL — ABNORMAL LOW (ref 3.5–5.0)
Alkaline Phosphatase: 192 U/L — ABNORMAL HIGH (ref 38–126)
Anion gap: 9 (ref 5–15)
BUN: 7 mg/dL (ref 6–20)
CO2: 29 mmol/L (ref 22–32)
Calcium: 8.8 mg/dL — ABNORMAL LOW (ref 8.9–10.3)
Chloride: 98 mmol/L (ref 98–111)
Creatinine, Ser: 0.66 mg/dL (ref 0.61–1.24)
GFR calc Af Amer: >60 mL/min (ref >60)
GFR calc non Af Amer: >60 mL/min (ref >60)
Glucose, Bld: 132 mg/dL — ABNORMAL HIGH (ref 70–99)
Potassium: 3.5 mmol/L (ref 3.5–5.1)
Sodium: 136 mmol/L (ref 135–145)
Total Bilirubin: 0.6 mg/dL (ref 0.3–1.2)
Total Protein: 6.3 g/dL — ABNORMAL LOW (ref 6.5–8.1)

## 2019-07-07 MED ORDER — POTASSIUM CHLORIDE CRYS ER 20 MEQ PO TBCR
40.0000 meq | EXTENDED_RELEASE_TABLET | Freq: Once | ORAL | Status: AC
  Administered 2019-07-07: 40 meq via ORAL
  Filled 2019-07-07: qty 2
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
## 2019-07-07 NOTE — Progress Notes (Signed)
Pt ambulated in a hallway this evening without any complain of pain, chest tube drainage is 0 cc in 12 hrs, dressing changed in his chest and chest tube site. Site is clean, dry and intact. Pt is in room air, ST on monitor most of the day, Central line removed this am, will continue to monitor the patient  Lonia Farber, Charity fundraiser

## 2019-07-07 NOTE — Plan of Care (Signed)

## 2019-07-07 NOTE — Progress Notes (Addendum)
    Asked to see patient and assist with removal of right UE dressing for duplex procedure.  Right UE absent motor, palpable radial pulse Incisions healing well Duplex performed 07/05/18 demonstrates patent bypass  S/P 1.  Exploration of right brachial artery 2.  Repair of transected right radial artery with interposition saphenous vein graft from left thigh 3.  Harvest of left great saphenous vein 4.  Forearm fasciotomy 5.  Placement of a forearm VAC.    Mosetta Pigeon PA-C I agree with the above.  Duplex shows patent bypass.  Durene Cal

## 2019-07-07 NOTE — Progress Notes (Addendum)
[address]       [place] [address]             [phone]       2 Days Post-Op Procedure(s) (LRB): SUBXYPHOID PERICARDIAL WINDOW (N/A) TRANSESOPHAGEAL ECHOCARDIOGRAM (TEE) (N/A) Drainage Of Pericardial Fluid (N/A)  Subjective: Patient sleeping. I told him central line would be removed today.  Objective: Vital signs in last 24 hours: Temp:  [98.1 F (36.7 C)-99.3 F (37.4 C)] 98.7 F (37.1 C) (01/10 0749) Pulse Rate:  [99-116] 99 (01/10 0749) Cardiac Rhythm: Sinus tachycardia (01/10 0416) Resp:  [16-24] 20 (01/10 0749) BP: (118-143)/(70-91) 131/91 (01/10 0749) SpO2:  [96 %-99 %] 99 % (01/10 0749)     Intake/Output from previous day: 01/09 0701 - 01/10 0700 In: 360 [P.O.:360] Out: 882 [Urine:850; Chest Tube:32]   Physical Exam:  Cardiovascular: RRR Pulmonary: Clear to auscultation bilaterally Abdomen: Soft, non tender, bowel sounds present. Extremities: Nol lower extremity edema. Wounds: Dressing intact Pericardial Tube: to suction  Lab Results: CBC: Recent Labs    07/06/19 0400 07/07/19 0431  WBC 11.1* 9.9  HGB 11.5* 11.1*  HCT 34.6* 33.7*  PLT 950* 821*   BMET:  Recent Labs    07/06/19 0400 07/07/19 0431  NA 135 136  K 3.8 3.5  CL 97* 98  CO2 26 29  GLUCOSE 108* 132*  BUN 9 7  CREATININE 0.74 0.66  CALCIUM 8.9 8.8*    PT/INR:  Recent Labs    07/04/19 1838  LABPROT 14.5  INR 1.1   ABG:  INR: Will add last result for INR, ABG once components are confirmed Will add last 4 CBG results once components are confirmed  Assessment/Plan:  1. CV - ST at times. Pericardial drain with 32 cc since surgery. Will remain for now. Culture showed no growth for one day 2.  Pulmonary - on room air. Check CXR in am. Encourage incentive spirometer 3. Anemia-H and H this am stable at 11.1 and 33.7 4. Supplement potassium 5. ALT and Alk phos decreased to 48 and 192 respectively 6. Remove central line 7. Duplex UE done  yesterday showed bypass graft appears patent  Donielle M ZimmermanPA-C 07/07/2019,8:53 AM  Agree with above Decreased CT output, will remove tomorrow Home soone  Judianne Seiple O Uriyah Raska

## 2019-07-08 ENCOUNTER — Inpatient Hospital Stay (HOSPITAL_COMMUNITY): Payer: Managed Care, Other (non HMO)

## 2019-07-08 ENCOUNTER — Ambulatory Visit: Payer: Managed Care, Other (non HMO) | Admitting: Occupational Therapy

## 2019-07-08 MED ORDER — ACETAMINOPHEN 500 MG PO TABS: 1000.0000 mg | ORAL_TABLET | Freq: Four times a day (QID) | ORAL | 0 refills | Status: DC

## 2019-07-08 NOTE — Progress Notes (Signed)
Became unpleasant when Bp is taken claiming " I need my sleep".explained to him the importance of taking the v/s.

## 2019-07-08 NOTE — Progress Notes (Signed)
Refused to ambulate, claimed "I need my sleep" to try encouraging him to walk later.

## 2019-07-08 NOTE — Progress Notes (Signed)
All set to go home. Discharge instructions given to pt. Awaiting ride home.

## 2019-07-08 NOTE — Progress Notes (Signed)
Patient ID: Bryce Pace, male   DOB: 1995-11-06, 24 y.o.   MRN: [id number] TCTS DAILY ICU PROGRESS NOTE                   [address]            [place] [address]          [phone]   3 Days Post-Op Procedure(s) (LRB): SUBXYPHOID PERICARDIAL WINDOW (N/A) TRANSESOPHAGEAL ECHOCARDIOGRAM (TEE) (N/A) Drainage Of Pericardial Fluid (N/A)  Total Length of Stay:  LOS: 4 days   Subjective: Patient feels well this am, HR 104   Objective: Vital signs in last 24 hours: Temp:  [98.1 F (36.7 C)-99 F (37.2 C)] 99 F (37.2 C) (01/11 0322) Pulse Rate:  [98-117] 99 (01/11 0322) Cardiac Rhythm: Normal sinus rhythm (01/11 0325) Resp:  [13-25] 13 (01/11 0322) BP: (121-140)/(81-94) 121/85 (01/11 0322) SpO2:  [98 %-100 %] 98 % (01/11 0322)  There were no vitals filed for this visit.  Weight change:    Hemodynamic parameters for last 24 hours:    Intake/Output from previous day: 01/10 0701 - 01/11 0700 In: 600 [P.O.:600] Out: 670 [Urine:650; Chest Tube:20]  Intake/Output this shift: No intake/output data recorded.  Current Meds: Scheduled Meds:  acetaminophen  1,000 mg Oral Q6H   Or   acetaminophen (TYLENOL) oral liquid 160 mg/5 mL  1,000 mg Oral Q6H   bisacodyl  10 mg Oral Daily   Chlorhexidine Gluconate Cloth  6 each Topical Daily   senna-docusate  1 tablet Oral QHS   Continuous Infusions:  sodium chloride     PRN Meds:.Place/Maintain arterial line **AND** sodium chloride, ondansetron (ZOFRAN) IV, oxyCODONE General appearance: alert and cooperative Resp: clear to auscultation bilaterally Cardio: regular rate and rhythm, S1, S2 normal, no murmur, click, rub or gallop Neurologic: Grossly normal some   Wounds intact  Lab Results: CBC: Recent Labs    07/06/19 0400 07/07/19 0431  WBC 11.1* 9.9  HGB 11.5* 11.1*  HCT 34.6* 33.7*  PLT 950* 821*   BMET:  Recent Labs    07/06/19 0400 07/07/19 0431  NA 135 136  K 3.8 3.5  CL 97* 98  CO2 26  29  GLUCOSE 108* 132*  BUN 9 7  CREATININE 0.74 0.66  CALCIUM 8.9 8.8*    CMET: Lab Results  Component Value Date   WBC 9.9 07/07/2019   HGB 11.1 (L) 07/07/2019   HCT 33.7 (L) 07/07/2019   PLT 821 (H) 07/07/2019   GLUCOSE 132 (H) 07/07/2019   ALT 48 (H) 07/07/2019   AST 28 07/07/2019   NA 136 07/07/2019   K 3.5 07/07/2019   CL 98 07/07/2019   CREATININE 0.66 07/07/2019   BUN 7 07/07/2019   CO2 29 07/07/2019   INR 1.1 07/04/2019      PT/INR: No results for input(s): LABPROT, INR in the last 72 hours. Radiology: No results found.   Assessment/Plan: S/P Procedure(s) (LRB): SUBXYPHOID PERICARDIAL WINDOW (N/A) TRANSESOPHAGEAL ECHOCARDIOGRAM (TEE) (N/A) Drainage Of Pericardial Fluid (N/A) Mobilize Diuresis D/c chest tube  Poss home later today     Delight Ovens 07/08/2019 7:33 AM

## 2019-07-08 NOTE — Progress Notes (Signed)
Chest tube removed  Tolerated well. Continue to monitor. Chest x-ray to be done.

## 2019-07-09 ENCOUNTER — Encounter: Payer: Managed Care, Other (non HMO) | Admitting: Thoracic Surgery (Cardiothoracic Vascular Surgery)

## 2019-07-10 ENCOUNTER — Ambulatory Visit: Payer: Managed Care, Other (non HMO) | Admitting: Occupational Therapy

## 2019-07-10 LAB — AEROBIC/ANAEROBIC CULTURE W GRAM STAIN (SURGICAL/DEEP WOUND)

## 2019-07-10 LAB — ACID FAST SMEAR (AFB, MYCOBACTERIA): Acid Fast Smear: NEGATIVE

## 2019-07-15 ENCOUNTER — Telehealth: Payer: Self-pay | Admitting: *Deleted

## 2019-07-15 NOTE — Telephone Encounter (Signed)
Bryce Pace has called for a refill for his Oxycodone.  He was hospitalized from 07/04/19-07/08/19 for drainage of pericardial effusion by Dr. Dorris Fetch.  Oxycodone was prescribed on dishcarge.  The pain he is experiencing is in        his right lower arm which is the site of his previous surgery by Dr. Dion Saucier in December 2020.  He missed his f/u apt. due to being in the hospital for this last surgery.  I instructed him to call Dr. Dion Saucier to reschedule his appointment and discuss his continued pain.  He and his fiance agreed.

## 2019-07-17 ENCOUNTER — Other Ambulatory Visit: Payer: Self-pay

## 2019-07-17 ENCOUNTER — Encounter: Payer: Managed Care, Other (non HMO) | Admitting: Occupational Therapy

## 2019-07-17 ENCOUNTER — Ambulatory Visit: Payer: Managed Care, Other (non HMO) | Admitting: Occupational Therapy

## 2019-07-17 DIAGNOSIS — M6281 Muscle weakness (generalized): Secondary | ICD-10-CM | POA: Diagnosis present

## 2019-07-17 DIAGNOSIS — R208 Other disturbances of skin sensation: Secondary | ICD-10-CM

## 2019-07-17 DIAGNOSIS — R209 Unspecified disturbances of skin sensation: Secondary | ICD-10-CM | POA: Diagnosis present

## 2019-07-17 DIAGNOSIS — R29818 Other symptoms and signs involving the nervous system: Secondary | ICD-10-CM

## 2019-07-17 NOTE — Patient Instructions (Signed)
AROM: Elbow Flexion / Extension    Gently bend elbow as far as possible. Then straighten arm as far as possible. Repeat _10-15___ times per set. Do _4-6___ sessions per day.    Elbow / Wrist Supination / Pronation  Elbow: Flexion   Use other hand to bend elbow with thumb toward the same shoulder. Do NOT force this motion. Hold _10___ seconds. Repeat _5-10___ times. Do _3-6___ sessions per day. CAUTION: Movement should be gentle, steady and slow.   Supination (Passive)   Keep elbow bent at right angle and held firmly at side. Use other hand to turn forearm until palm faces upward. Hold __10__ seconds. Repeat _5-10___ times. Do _4-6___ sessions per day.   Pronation (Passive)   Keep elbow bent at right angle and held firmly to side. Use other hand to turn forearm until palm faces downward. Hold __10__ seconds. Repeat __5-10__ times. Do __4-6__ sessions per day.   PROM: Wrist Flexion / Extension   Grasp  hand and slowly bend wrist until stretch is felt. Relax. Then stretch as far as possible in opposite direction. Be sure to keep elbow bent.  Hold __10__ sec. each way Repeat _5___ times per set.    Do _4-6___ sessions per day.         Finger Flexion / Extension  Help with your other hand to  bend fingers of left hand toward palm, making a  fist. Straighten fingers, opening fist. Repeat sequence _10-15___ times per session. Do _4-6__ sessions per day. Hand Variation: Palm down   Copyright  VHI. All rights reserved.
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
## 2019-07-19 ENCOUNTER — Other Ambulatory Visit: Payer: Self-pay

## 2019-07-19 ENCOUNTER — Ambulatory Visit: Payer: Managed Care, Other (non HMO) | Admitting: Occupational Therapy

## 2019-07-19 DIAGNOSIS — R29818 Other symptoms and signs involving the nervous system: Secondary | ICD-10-CM

## 2019-07-19 DIAGNOSIS — M6281 Muscle weakness (generalized): Secondary | ICD-10-CM

## 2019-07-19 DIAGNOSIS — R208 Other disturbances of skin sensation: Secondary | ICD-10-CM
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
## 2019-07-23 ENCOUNTER — Other Ambulatory Visit: Payer: Self-pay

## 2019-07-23 ENCOUNTER — Ambulatory Visit
Admission: RE | Admit: 2019-07-23 | Discharge: 2019-07-23 | Disposition: A | Discharge status: Home / Self Care | Payer: Managed Care, Other (non HMO) | Source: Ambulatory Visit | Attending: Thoracic Surgery (Cardiothoracic Vascular Surgery) | Admitting: Thoracic Surgery (Cardiothoracic Vascular Surgery)

## 2019-07-23 ENCOUNTER — Ambulatory Visit (INDEPENDENT_AMBULATORY_CARE_PROVIDER_SITE_OTHER): Payer: Self-pay | Admitting: Thoracic Surgery (Cardiothoracic Vascular Surgery)

## 2019-07-23 ENCOUNTER — Other Ambulatory Visit: Payer: Self-pay | Admitting: Thoracic Surgery (Cardiothoracic Vascular Surgery)

## 2019-07-23 VITALS — BP 136/91 | HR 102 | Temp 97.7°F | Resp 16 | Ht 71.0 in

## 2019-07-23 DIAGNOSIS — Z9889 Other specified postprocedural states: Secondary | ICD-10-CM

## 2019-07-23 DIAGNOSIS — I3139 Other pericardial effusion (noninflammatory): Secondary | ICD-10-CM

## 2019-07-23 DIAGNOSIS — W3400XA Accidental discharge from unspecified firearms or gun, initial encounter: Secondary | ICD-10-CM

## 2019-07-23 DIAGNOSIS — Z09 Encounter for follow-up examination after completed treatment for conditions other than malignant neoplasm: Secondary | ICD-10-CM

## 2019-07-23 DIAGNOSIS — I313 Pericardial effusion (noninflammatory): Secondary | ICD-10-CM
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
## 2019-07-24 ENCOUNTER — Ambulatory Visit: Payer: Managed Care, Other (non HMO) | Admitting: Occupational Therapy

## 2019-07-24 DIAGNOSIS — R208 Other disturbances of skin sensation: Secondary | ICD-10-CM

## 2019-07-24 DIAGNOSIS — M6281 Muscle weakness (generalized): Secondary | ICD-10-CM | POA: Diagnosis not present

## 2019-07-24 DIAGNOSIS — R29818 Other symptoms and signs involving the nervous system: Secondary | ICD-10-CM
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
## 2019-07-29 ENCOUNTER — Ambulatory Visit: Payer: 59 | Attending: General Surgery | Admitting: Occupational Therapy

## 2019-07-29 ENCOUNTER — Other Ambulatory Visit: Payer: Self-pay

## 2019-07-29 ENCOUNTER — Encounter: Payer: Self-pay | Admitting: Occupational Therapy

## 2019-07-29 DIAGNOSIS — R29818 Other symptoms and signs involving the nervous system: Secondary | ICD-10-CM | POA: Insufficient documentation

## 2019-07-29 DIAGNOSIS — M6281 Muscle weakness (generalized): Secondary | ICD-10-CM

## 2019-07-29 DIAGNOSIS — R208 Other disturbances of skin sensation: Secondary | ICD-10-CM

## 2019-07-29 DIAGNOSIS — R209 Unspecified disturbances of skin sensation: Secondary | ICD-10-CM | POA: Diagnosis present
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
## 2019-07-31 ENCOUNTER — Other Ambulatory Visit: Payer: Self-pay

## 2019-07-31 ENCOUNTER — Ambulatory Visit: Payer: 59 | Admitting: Occupational Therapy

## 2019-07-31 DIAGNOSIS — M6281 Muscle weakness (generalized): Secondary | ICD-10-CM

## 2019-07-31 DIAGNOSIS — R29818 Other symptoms and signs involving the nervous system: Secondary | ICD-10-CM

## 2019-07-31 DIAGNOSIS — R208 Other disturbances of skin sensation: Secondary | ICD-10-CM
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
## 2019-08-05 ENCOUNTER — Ambulatory Visit: Payer: 59 | Admitting: Occupational Therapy

## 2019-08-05 ENCOUNTER — Other Ambulatory Visit: Payer: Self-pay

## 2019-08-05 ENCOUNTER — Encounter: Payer: Self-pay | Admitting: Occupational Therapy

## 2019-08-05 DIAGNOSIS — R29818 Other symptoms and signs involving the nervous system: Secondary | ICD-10-CM

## 2019-08-05 DIAGNOSIS — M6281 Muscle weakness (generalized): Secondary | ICD-10-CM

## 2019-08-05 DIAGNOSIS — R208 Other disturbances of skin sensation: Secondary | ICD-10-CM
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
## 2019-08-07 ENCOUNTER — Other Ambulatory Visit: Payer: Self-pay

## 2019-08-07 ENCOUNTER — Ambulatory Visit: Payer: 59 | Admitting: Occupational Therapy

## 2019-08-07 DIAGNOSIS — M6281 Muscle weakness (generalized): Secondary | ICD-10-CM

## 2019-08-07 DIAGNOSIS — R208 Other disturbances of skin sensation: Secondary | ICD-10-CM

## 2019-08-07 DIAGNOSIS — R29818 Other symptoms and signs involving the nervous system: Secondary | ICD-10-CM | POA: Diagnosis not present
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
## 2019-08-14 ENCOUNTER — Ambulatory Visit: Payer: 59 | Admitting: Occupational Therapy

## 2019-08-17 LAB — ACID FAST CULTURE WITH REFLEXED SENSITIVITIES (MYCOBACTERIA): Acid Fast Culture: NEGATIVE

## 2019-08-27 ENCOUNTER — Other Ambulatory Visit: Payer: Self-pay

## 2019-08-27 ENCOUNTER — Encounter: Payer: Self-pay | Admitting: Occupational Therapy

## 2019-08-27 ENCOUNTER — Ambulatory Visit: Payer: 59 | Attending: General Surgery | Admitting: Occupational Therapy

## 2019-08-27 DIAGNOSIS — R278 Other lack of coordination: Secondary | ICD-10-CM | POA: Insufficient documentation

## 2019-08-27 DIAGNOSIS — R29818 Other symptoms and signs involving the nervous system: Secondary | ICD-10-CM

## 2019-08-27 DIAGNOSIS — R208 Other disturbances of skin sensation: Secondary | ICD-10-CM

## 2019-08-27 DIAGNOSIS — M6281 Muscle weakness (generalized): Secondary | ICD-10-CM | POA: Diagnosis present

## 2019-08-27 DIAGNOSIS — R209 Unspecified disturbances of skin sensation: Secondary | ICD-10-CM | POA: Diagnosis present

## 2019-08-27 DIAGNOSIS — M25621 Stiffness of right elbow, not elsewhere classified: Secondary | ICD-10-CM | POA: Diagnosis present
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
## 2019-08-29 ENCOUNTER — Ambulatory Visit: Payer: 59 | Admitting: Occupational Therapy

## 2019-08-29 ENCOUNTER — Other Ambulatory Visit: Payer: Self-pay

## 2019-08-29 DIAGNOSIS — R208 Other disturbances of skin sensation: Secondary | ICD-10-CM

## 2019-08-29 DIAGNOSIS — R29818 Other symptoms and signs involving the nervous system: Secondary | ICD-10-CM

## 2019-08-29 DIAGNOSIS — M6281 Muscle weakness (generalized): Secondary | ICD-10-CM
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
## 2019-09-03 ENCOUNTER — Ambulatory Visit (INDEPENDENT_AMBULATORY_CARE_PROVIDER_SITE_OTHER): Payer: Self-pay | Admitting: Thoracic Surgery (Cardiothoracic Vascular Surgery)

## 2019-09-03 ENCOUNTER — Other Ambulatory Visit: Payer: Self-pay

## 2019-09-03 ENCOUNTER — Ambulatory Visit: Payer: Managed Care, Other (non HMO) | Admitting: Thoracic Surgery (Cardiothoracic Vascular Surgery)

## 2019-09-03 ENCOUNTER — Encounter: Payer: Self-pay | Admitting: Thoracic Surgery (Cardiothoracic Vascular Surgery)

## 2019-09-03 VITALS — BP 129/74 | HR 80 | Temp 97.7°F | Resp 16 | Ht 71.0 in | Wt 146.6 lb

## 2019-09-03 DIAGNOSIS — I313 Pericardial effusion (noninflammatory): Secondary | ICD-10-CM

## 2019-09-03 DIAGNOSIS — Z9889 Other specified postprocedural states: Secondary | ICD-10-CM

## 2019-09-03 DIAGNOSIS — I3139 Other pericardial effusion (noninflammatory): Secondary | ICD-10-CM

## 2019-09-03 DIAGNOSIS — W3400XA Accidental discharge from unspecified firearms or gun, initial encounter: Secondary | ICD-10-CM

## 2019-09-03 DIAGNOSIS — Y249XXA Unspecified firearm discharge, undetermined intent, initial encounter: Secondary | ICD-10-CM

## 2019-09-03 DIAGNOSIS — Z09 Encounter for follow-up examination after completed treatment for conditions other than malignant neoplasm: Secondary | ICD-10-CM
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
## 2019-09-04 ENCOUNTER — Ambulatory Visit: Payer: 59 | Admitting: Occupational Therapy

## 2019-09-04 DIAGNOSIS — R208 Other disturbances of skin sensation: Secondary | ICD-10-CM

## 2019-09-04 DIAGNOSIS — R29818 Other symptoms and signs involving the nervous system: Secondary | ICD-10-CM

## 2019-09-04 DIAGNOSIS — M6281 Muscle weakness (generalized): Secondary | ICD-10-CM
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
## 2019-09-06 ENCOUNTER — Ambulatory Visit: Payer: 59 | Admitting: Occupational Therapy

## 2019-09-06 ENCOUNTER — Other Ambulatory Visit: Payer: Self-pay

## 2019-09-06 DIAGNOSIS — R29818 Other symptoms and signs involving the nervous system: Secondary | ICD-10-CM | POA: Diagnosis not present

## 2019-09-06 DIAGNOSIS — R208 Other disturbances of skin sensation: Secondary | ICD-10-CM

## 2019-09-06 DIAGNOSIS — M6281 Muscle weakness (generalized): Secondary | ICD-10-CM
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
## 2019-09-10 ENCOUNTER — Other Ambulatory Visit: Payer: Self-pay

## 2019-09-10 ENCOUNTER — Ambulatory Visit: Payer: 59 | Admitting: Occupational Therapy

## 2019-09-10 DIAGNOSIS — M6281 Muscle weakness (generalized): Secondary | ICD-10-CM

## 2019-09-10 DIAGNOSIS — R208 Other disturbances of skin sensation: Secondary | ICD-10-CM

## 2019-09-10 DIAGNOSIS — R29818 Other symptoms and signs involving the nervous system: Secondary | ICD-10-CM | POA: Diagnosis not present
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
## 2019-09-12 ENCOUNTER — Ambulatory Visit: Payer: 59 | Admitting: Occupational Therapy

## 2019-09-17 ENCOUNTER — Ambulatory Visit: Payer: 59 | Admitting: Occupational Therapy

## 2019-09-19 ENCOUNTER — Other Ambulatory Visit: Payer: Self-pay

## 2019-09-19 ENCOUNTER — Ambulatory Visit: Payer: 59 | Admitting: Occupational Therapy

## 2019-09-19 DIAGNOSIS — R208 Other disturbances of skin sensation: Secondary | ICD-10-CM

## 2019-09-19 DIAGNOSIS — M6281 Muscle weakness (generalized): Secondary | ICD-10-CM

## 2019-09-19 DIAGNOSIS — R29818 Other symptoms and signs involving the nervous system: Secondary | ICD-10-CM
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
## 2019-09-23 ENCOUNTER — Ambulatory Visit: Payer: 59 | Admitting: Occupational Therapy

## 2019-09-25 ENCOUNTER — Ambulatory Visit: Payer: 59 | Admitting: Occupational Therapy

## 2019-09-25 ENCOUNTER — Encounter: Payer: Self-pay | Admitting: Vascular Surgery

## 2019-09-25 ENCOUNTER — Ambulatory Visit (INDEPENDENT_AMBULATORY_CARE_PROVIDER_SITE_OTHER): Payer: 59 | Admitting: Vascular Surgery

## 2019-09-25 ENCOUNTER — Other Ambulatory Visit: Payer: Self-pay

## 2019-09-25 VITALS — BP 112/68 | HR 68 | Temp 97.9°F | Resp 20 | Ht 71.0 in | Wt 146.0 lb

## 2019-09-25 DIAGNOSIS — R29818 Other symptoms and signs involving the nervous system: Secondary | ICD-10-CM

## 2019-09-25 DIAGNOSIS — Z48812 Encounter for surgical aftercare following surgery on the circulatory system: Secondary | ICD-10-CM | POA: Diagnosis not present

## 2019-09-25 DIAGNOSIS — W3400XA Accidental discharge from unspecified firearms or gun, initial encounter: Secondary | ICD-10-CM

## 2019-09-25 DIAGNOSIS — R278 Other lack of coordination: Secondary | ICD-10-CM

## 2019-09-25 DIAGNOSIS — R208 Other disturbances of skin sensation: Secondary | ICD-10-CM

## 2019-09-25 DIAGNOSIS — S55191A Other specified injury of radial artery at forearm level, right arm, initial encounter: Secondary | ICD-10-CM

## 2019-09-25 DIAGNOSIS — M25621 Stiffness of right elbow, not elsewhere classified: Secondary | ICD-10-CM

## 2019-09-25 DIAGNOSIS — M6281 Muscle weakness (generalized): Secondary | ICD-10-CM
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
## 2019-10-01 ENCOUNTER — Other Ambulatory Visit: Payer: Self-pay

## 2019-10-01 ENCOUNTER — Ambulatory Visit: Payer: 59 | Attending: General Surgery | Admitting: Occupational Therapy

## 2019-10-01 ENCOUNTER — Encounter: Payer: Self-pay | Admitting: Occupational Therapy

## 2019-10-01 DIAGNOSIS — R209 Unspecified disturbances of skin sensation: Secondary | ICD-10-CM | POA: Insufficient documentation

## 2019-10-01 DIAGNOSIS — M6281 Muscle weakness (generalized): Secondary | ICD-10-CM | POA: Diagnosis present

## 2019-10-01 DIAGNOSIS — R29818 Other symptoms and signs involving the nervous system: Secondary | ICD-10-CM

## 2019-10-01 DIAGNOSIS — M25621 Stiffness of right elbow, not elsewhere classified: Secondary | ICD-10-CM | POA: Diagnosis present

## 2019-10-01 DIAGNOSIS — R278 Other lack of coordination: Secondary | ICD-10-CM | POA: Diagnosis present

## 2019-10-01 DIAGNOSIS — R208 Other disturbances of skin sensation: Secondary | ICD-10-CM
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
## 2019-10-01 NOTE — Patient Instructions (Signed)
Access Code: [email or web address] URL: [email or web address] Date: 10/01/2019 Prepared by: Merleen Milliner  Exercises Supine Shoulder Press with Dowel - 2 x daily - 7 x weekly - 3 sets - 10 reps Supine Shoulder Flexion with Dowel - 2 x daily - 7 x weekly - 3 sets - 10 reps

## 2019-10-02 ENCOUNTER — Ambulatory Visit: Payer: Managed Care, Other (non HMO) | Admitting: Vascular Surgery

## 2019-10-08 ENCOUNTER — Ambulatory Visit: Payer: 59 | Admitting: Occupational Therapy

## 2019-10-09 ENCOUNTER — Ambulatory Visit: Payer: 59 | Admitting: Occupational Therapy

## 2019-10-09 ENCOUNTER — Encounter: Payer: Self-pay | Admitting: Occupational Therapy

## 2019-10-09 ENCOUNTER — Other Ambulatory Visit: Payer: Self-pay

## 2019-10-09 DIAGNOSIS — R278 Other lack of coordination: Secondary | ICD-10-CM

## 2019-10-09 DIAGNOSIS — R29818 Other symptoms and signs involving the nervous system: Secondary | ICD-10-CM

## 2019-10-09 DIAGNOSIS — M6281 Muscle weakness (generalized): Secondary | ICD-10-CM | POA: Diagnosis not present

## 2019-10-09 DIAGNOSIS — M25621 Stiffness of right elbow, not elsewhere classified: Secondary | ICD-10-CM

## 2019-10-09 DIAGNOSIS — R208 Other disturbances of skin sensation: Secondary | ICD-10-CM
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
## 2019-10-15 ENCOUNTER — Other Ambulatory Visit: Payer: Self-pay

## 2019-10-15 ENCOUNTER — Ambulatory Visit: Payer: 59 | Admitting: Occupational Therapy

## 2019-10-15 ENCOUNTER — Encounter: Payer: Self-pay | Admitting: Occupational Therapy

## 2019-10-15 DIAGNOSIS — M6281 Muscle weakness (generalized): Secondary | ICD-10-CM

## 2019-10-15 DIAGNOSIS — M25621 Stiffness of right elbow, not elsewhere classified: Secondary | ICD-10-CM

## 2019-10-15 DIAGNOSIS — R208 Other disturbances of skin sensation: Secondary | ICD-10-CM

## 2019-10-15 DIAGNOSIS — R278 Other lack of coordination: Secondary | ICD-10-CM

## 2019-10-15 DIAGNOSIS — R29818 Other symptoms and signs involving the nervous system: Secondary | ICD-10-CM
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
## 2019-10-17 ENCOUNTER — Encounter: Payer: Self-pay | Admitting: Occupational Therapy

## 2019-10-17 ENCOUNTER — Other Ambulatory Visit: Payer: Self-pay

## 2019-10-17 ENCOUNTER — Ambulatory Visit: Payer: 59 | Admitting: Occupational Therapy

## 2019-10-17 DIAGNOSIS — M6281 Muscle weakness (generalized): Secondary | ICD-10-CM | POA: Diagnosis not present

## 2019-10-17 DIAGNOSIS — R208 Other disturbances of skin sensation: Secondary | ICD-10-CM

## 2019-10-17 DIAGNOSIS — R29818 Other symptoms and signs involving the nervous system: Secondary | ICD-10-CM

## 2019-10-17 DIAGNOSIS — R278 Other lack of coordination: Secondary | ICD-10-CM

## 2019-10-17 DIAGNOSIS — M25621 Stiffness of right elbow, not elsewhere classified: Secondary | ICD-10-CM
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
## 2019-10-17 NOTE — Patient Instructions (Signed)
WEARING SCHEDULE:   Wear new custom splint when using your right hand to manipulate small items - like at work.   Wear current black splint when more protection is needed - around your son, or in big crowds.    First time wearing brace - wear for one hour, remove and ensure there are no pressure areas that stay for more than a minute.  If any red areas of pressure appear - stop wearing splint and bring it in to your next appointment to be adjusted.

## 2019-10-22 ENCOUNTER — Encounter: Payer: 59 | Admitting: Occupational Therapy

## 2019-10-24 ENCOUNTER — Other Ambulatory Visit: Payer: Self-pay

## 2019-10-24 ENCOUNTER — Ambulatory Visit: Payer: 59 | Admitting: Occupational Therapy

## 2019-10-24 ENCOUNTER — Encounter: Payer: Self-pay | Admitting: Occupational Therapy

## 2019-10-24 DIAGNOSIS — M6281 Muscle weakness (generalized): Secondary | ICD-10-CM

## 2019-10-24 DIAGNOSIS — R29818 Other symptoms and signs involving the nervous system: Secondary | ICD-10-CM

## 2019-10-24 DIAGNOSIS — R208 Other disturbances of skin sensation: Secondary | ICD-10-CM

## 2019-10-24 DIAGNOSIS — M25621 Stiffness of right elbow, not elsewhere classified: Secondary | ICD-10-CM
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
## 2019-10-29 ENCOUNTER — Ambulatory Visit: Payer: 59 | Attending: General Surgery | Admitting: Occupational Therapy

## 2019-10-29 ENCOUNTER — Other Ambulatory Visit: Payer: Self-pay

## 2019-10-29 DIAGNOSIS — R278 Other lack of coordination: Secondary | ICD-10-CM | POA: Diagnosis present

## 2019-10-29 DIAGNOSIS — R29818 Other symptoms and signs involving the nervous system: Secondary | ICD-10-CM

## 2019-10-29 DIAGNOSIS — R209 Unspecified disturbances of skin sensation: Secondary | ICD-10-CM | POA: Insufficient documentation

## 2019-10-29 DIAGNOSIS — M25621 Stiffness of right elbow, not elsewhere classified: Secondary | ICD-10-CM | POA: Insufficient documentation

## 2019-10-29 DIAGNOSIS — R208 Other disturbances of skin sensation: Secondary | ICD-10-CM

## 2019-10-29 DIAGNOSIS — M6281 Muscle weakness (generalized): Secondary | ICD-10-CM | POA: Insufficient documentation
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
## 2019-10-30 ENCOUNTER — Encounter: Payer: Self-pay | Admitting: Occupational Therapy
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
## 2019-11-05 ENCOUNTER — Other Ambulatory Visit: Payer: Self-pay

## 2019-11-05 ENCOUNTER — Ambulatory Visit: Payer: 59 | Admitting: Occupational Therapy

## 2019-11-05 DIAGNOSIS — R29818 Other symptoms and signs involving the nervous system: Secondary | ICD-10-CM

## 2019-11-05 DIAGNOSIS — R278 Other lack of coordination: Secondary | ICD-10-CM

## 2019-11-05 DIAGNOSIS — M6281 Muscle weakness (generalized): Secondary | ICD-10-CM | POA: Diagnosis not present

## 2019-11-05 DIAGNOSIS — R208 Other disturbances of skin sensation: Secondary | ICD-10-CM

## 2019-11-05 DIAGNOSIS — M25621 Stiffness of right elbow, not elsewhere classified: Secondary | ICD-10-CM
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
## 2019-11-05 NOTE — Patient Instructions (Signed)
Scapular Retraction (Prone)    Lie with arms at sides. Pinch shoulder blades together and raise arms a few inches from floor. Repeat __10__ times per set. Do ___2_ sets per session. Do ___1_ sessions per day.  http://orth.exer.us/954   Copyright  VHI. All rights reserved.  Extension - Prone (Dumbbell)    Lie with right arm hanging off side of bed. Lift hand back and up without weight, elbow bent Repeat 10____ times per set. Do ___2_ sets per session. Do _7___ sessions per week.    Copyright  VHI. All rights reserved.

## 2019-11-12 ENCOUNTER — Ambulatory Visit: Payer: 59 | Admitting: Occupational Therapy

## 2019-11-12 ENCOUNTER — Other Ambulatory Visit: Payer: Self-pay

## 2019-11-12 ENCOUNTER — Encounter: Payer: Self-pay | Admitting: Occupational Therapy

## 2019-11-12 DIAGNOSIS — R29818 Other symptoms and signs involving the nervous system: Secondary | ICD-10-CM

## 2019-11-12 DIAGNOSIS — R278 Other lack of coordination: Secondary | ICD-10-CM

## 2019-11-12 DIAGNOSIS — M25621 Stiffness of right elbow, not elsewhere classified: Secondary | ICD-10-CM

## 2019-11-12 DIAGNOSIS — R208 Other disturbances of skin sensation: Secondary | ICD-10-CM

## 2019-11-12 DIAGNOSIS — M6281 Muscle weakness (generalized): Secondary | ICD-10-CM
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
## 2019-11-19 ENCOUNTER — Encounter: Payer: 59 | Admitting: Occupational Therapy

## 2019-12-31 ENCOUNTER — Other Ambulatory Visit: Payer: Self-pay

## 2019-12-31 ENCOUNTER — Ambulatory Visit: Payer: 59 | Attending: General Surgery | Admitting: Occupational Therapy

## 2019-12-31 DIAGNOSIS — M25621 Stiffness of right elbow, not elsewhere classified: Secondary | ICD-10-CM | POA: Diagnosis present

## 2019-12-31 DIAGNOSIS — R29818 Other symptoms and signs involving the nervous system: Secondary | ICD-10-CM

## 2019-12-31 DIAGNOSIS — R208 Other disturbances of skin sensation: Secondary | ICD-10-CM

## 2019-12-31 DIAGNOSIS — M6281 Muscle weakness (generalized): Secondary | ICD-10-CM | POA: Insufficient documentation

## 2019-12-31 DIAGNOSIS — R278 Other lack of coordination: Secondary | ICD-10-CM | POA: Diagnosis present

## 2019-12-31 DIAGNOSIS — R209 Unspecified disturbances of skin sensation: Secondary | ICD-10-CM | POA: Diagnosis present

## 2019-12-31 NOTE — Patient Instructions (Addendum)
Scapular Retraction (Prone)    Lie with arms at sides. Pinch shoulder blades together and raise arms a few inches from floor. Repeat __10__ times per set. Do ___2_ sets per session. Do ___1_ sessions per day.  http://orth.exer.us/954   Copyright  VHI. All rights reserved.  Extension - Prone (Dumbbell)    Lie with right arm hanging off side of bed. Lift hand back and up without weight, elbow bent Repeat 10____ times per set. Do ___2_ sets per session. Do _7___ sessions per week.    Hold on to your walking pole at the top in standing stretch arm forwards and backwards 10-15 reps then slightly out to the side for 10-15 reps, STOP if painful.  Place your walking stick on a chair or slightly elevated surface, wrap a washcloth around pole and slide right hand up and down while holding pole with left hand.

## 2020-01-02 ENCOUNTER — Encounter: Payer: Self-pay | Admitting: Occupational Therapy
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
## 2020-02-11 ENCOUNTER — Other Ambulatory Visit: Payer: Self-pay

## 2020-02-11 ENCOUNTER — Emergency Department (HOSPITAL_COMMUNITY): Payer: 59

## 2020-02-11 ENCOUNTER — Encounter (HOSPITAL_COMMUNITY): Payer: Self-pay

## 2020-02-11 ENCOUNTER — Emergency Department (HOSPITAL_COMMUNITY)
Admission: EM | Admit: 2020-02-11 | Discharge: 2020-02-12 | Disposition: A | Discharge status: Home / Self Care | Payer: 59 | Attending: Emergency Medicine | Admitting: Emergency Medicine

## 2020-02-11 DIAGNOSIS — S060X0A Concussion without loss of consciousness, initial encounter: Secondary | ICD-10-CM | POA: Insufficient documentation

## 2020-02-11 DIAGNOSIS — Y9389 Activity, other specified: Secondary | ICD-10-CM | POA: Diagnosis not present

## 2020-02-11 DIAGNOSIS — S63501A Unspecified sprain of right wrist, initial encounter: Secondary | ICD-10-CM

## 2020-02-11 DIAGNOSIS — S299XXA Unspecified injury of thorax, initial encounter: Secondary | ICD-10-CM | POA: Insufficient documentation

## 2020-02-11 DIAGNOSIS — Y999 Unspecified external cause status: Secondary | ICD-10-CM | POA: Diagnosis not present

## 2020-02-11 DIAGNOSIS — Z87891 Personal history of nicotine dependence: Secondary | ICD-10-CM | POA: Diagnosis not present

## 2020-02-11 DIAGNOSIS — S56911A Strain of unspecified muscles, fascia and tendons at forearm level, right arm, initial encounter: Secondary | ICD-10-CM | POA: Diagnosis not present

## 2020-02-11 DIAGNOSIS — Z041 Encounter for examination and observation following transport accident: Secondary | ICD-10-CM | POA: Diagnosis not present

## 2020-02-11 DIAGNOSIS — Y92410 Unspecified street and highway as the place of occurrence of the external cause: Secondary | ICD-10-CM | POA: Insufficient documentation

## 2020-02-11 DIAGNOSIS — S0990XA Unspecified injury of head, initial encounter: Secondary | ICD-10-CM | POA: Diagnosis present

## 2020-02-11 DIAGNOSIS — S298XXA Other specified injuries of thorax, initial encounter: Secondary | ICD-10-CM

## 2020-02-11 DIAGNOSIS — S3991XA Unspecified injury of abdomen, initial encounter: Secondary | ICD-10-CM | POA: Diagnosis not present

## 2020-02-11 MED ORDER — SODIUM CHLORIDE 0.9 % IV BOLUS
1000.0000 mL | Freq: Once | INTRAVENOUS | Status: AC
  Administered 2020-02-12: 1000 mL via INTRAVENOUS

## 2020-02-11 MED ORDER — MORPHINE SULFATE (PF) 4 MG/ML IV SOLN
4.0000 mg | Freq: Once | INTRAVENOUS | Status: DC
  Administered 2020-02-12: 4 mg via INTRAVENOUS
  Filled 2020-02-11: qty 1
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
## 2020-02-12 ENCOUNTER — Emergency Department (HOSPITAL_COMMUNITY): Payer: 59

## 2020-02-12 ENCOUNTER — Encounter (HOSPITAL_COMMUNITY): Payer: Self-pay

## 2020-02-12 LAB — COMPREHENSIVE METABOLIC PANEL
ALT: 35 U/L (ref 0–44)
AST: 24 U/L (ref 15–41)
Albumin: 4.4 g/dL (ref 3.5–5.0)
Alkaline Phosphatase: 80 U/L (ref 38–126)
Anion gap: 10 (ref 5–15)
BUN: 13 mg/dL (ref 6–20)
CO2: 28 mmol/L (ref 22–32)
Calcium: 9 mg/dL (ref 8.9–10.3)
Chloride: 103 mmol/L (ref 98–111)
Creatinine, Ser: 0.92 mg/dL (ref 0.61–1.24)
GFR calc Af Amer: >60 mL/min (ref >60)
GFR calc non Af Amer: >60 mL/min (ref >60)
Glucose, Bld: 104 mg/dL — ABNORMAL HIGH (ref 70–99)
Potassium: 3.4 mmol/L — ABNORMAL LOW (ref 3.5–5.1)
Sodium: 141 mmol/L (ref 135–145)
Total Bilirubin: 0.7 mg/dL (ref 0.3–1.2)
Total Protein: 6.8 g/dL (ref 6.5–8.1)

## 2020-02-12 LAB — I-STAT CHEM 8, ED
BUN: 12 mg/dL (ref 6–20)
Calcium, Ion: 1.18 mmol/L (ref 1.15–1.40)
Chloride: 103 mmol/L (ref 98–111)
Creatinine, Ser: 0.9 mg/dL (ref 0.61–1.24)
Glucose, Bld: 100 mg/dL — ABNORMAL HIGH (ref 70–99)
HCT: 43 % (ref 39.0–52.0)
Hemoglobin: 14.6 g/dL (ref 13.0–17.0)
Potassium: 3.4 mmol/L — ABNORMAL LOW (ref 3.5–5.1)
Sodium: 143 mmol/L (ref 135–145)
TCO2: 28 mmol/L (ref 22–32)

## 2020-02-12 LAB — CBC
HCT: 42 % (ref 39.0–52.0)
Hemoglobin: 14.4 g/dL (ref 13.0–17.0)
MCH: 30.6 pg (ref 26.0–34.0)
MCHC: 34.3 g/dL (ref 30.0–36.0)
MCV: 89.4 fL (ref 80.0–100.0)
Platelets: 240 10*3/uL (ref 150–400)
RBC: 4.7 MIL/uL (ref 4.22–5.81)
RDW: 11.9 % (ref 11.5–15.5)
WBC: 7 10*3/uL (ref 4.0–10.5)
nRBC: 0 % (ref 0.0–0.2)

## 2020-02-12 LAB — ETHANOL: Alcohol, Ethyl (B): <10 mg/dL (ref <10)

## 2020-02-12 LAB — SAMPLE TO BLOOD BANK

## 2020-02-12 LAB — PROTIME-INR
INR: 1 (ref 0.8–1.2)
Prothrombin Time: 13.1 seconds (ref 11.4–15.2)

## 2020-02-12 MED ORDER — SODIUM CHLORIDE (PF) 0.9 % IJ SOLN
INTRAMUSCULAR | Status: AC
  Filled 2020-02-12: qty 50

## 2020-02-12 MED ORDER — IOHEXOL 300 MG/ML  SOLN
100.0000 mL | Freq: Once | INTRAMUSCULAR | Status: AC | PRN
  Administered 2020-02-12: 100 mL via INTRAVENOUS
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
## 2020-02-12 NOTE — ED Notes (Signed)
Pt. A+O x4  Ambulated the room independently. Pt. Has no concerns at this time.

## 2020-07-08 IMAGING — CR DG CHEST 1V PORT
1 series · 1 of 1 positions shown · non-contrast
Comparison: 07/04/2019

CLINICAL DATA: Status post pericardial window

EXAM:
PORTABLE CHEST 1 VIEW

[AP]
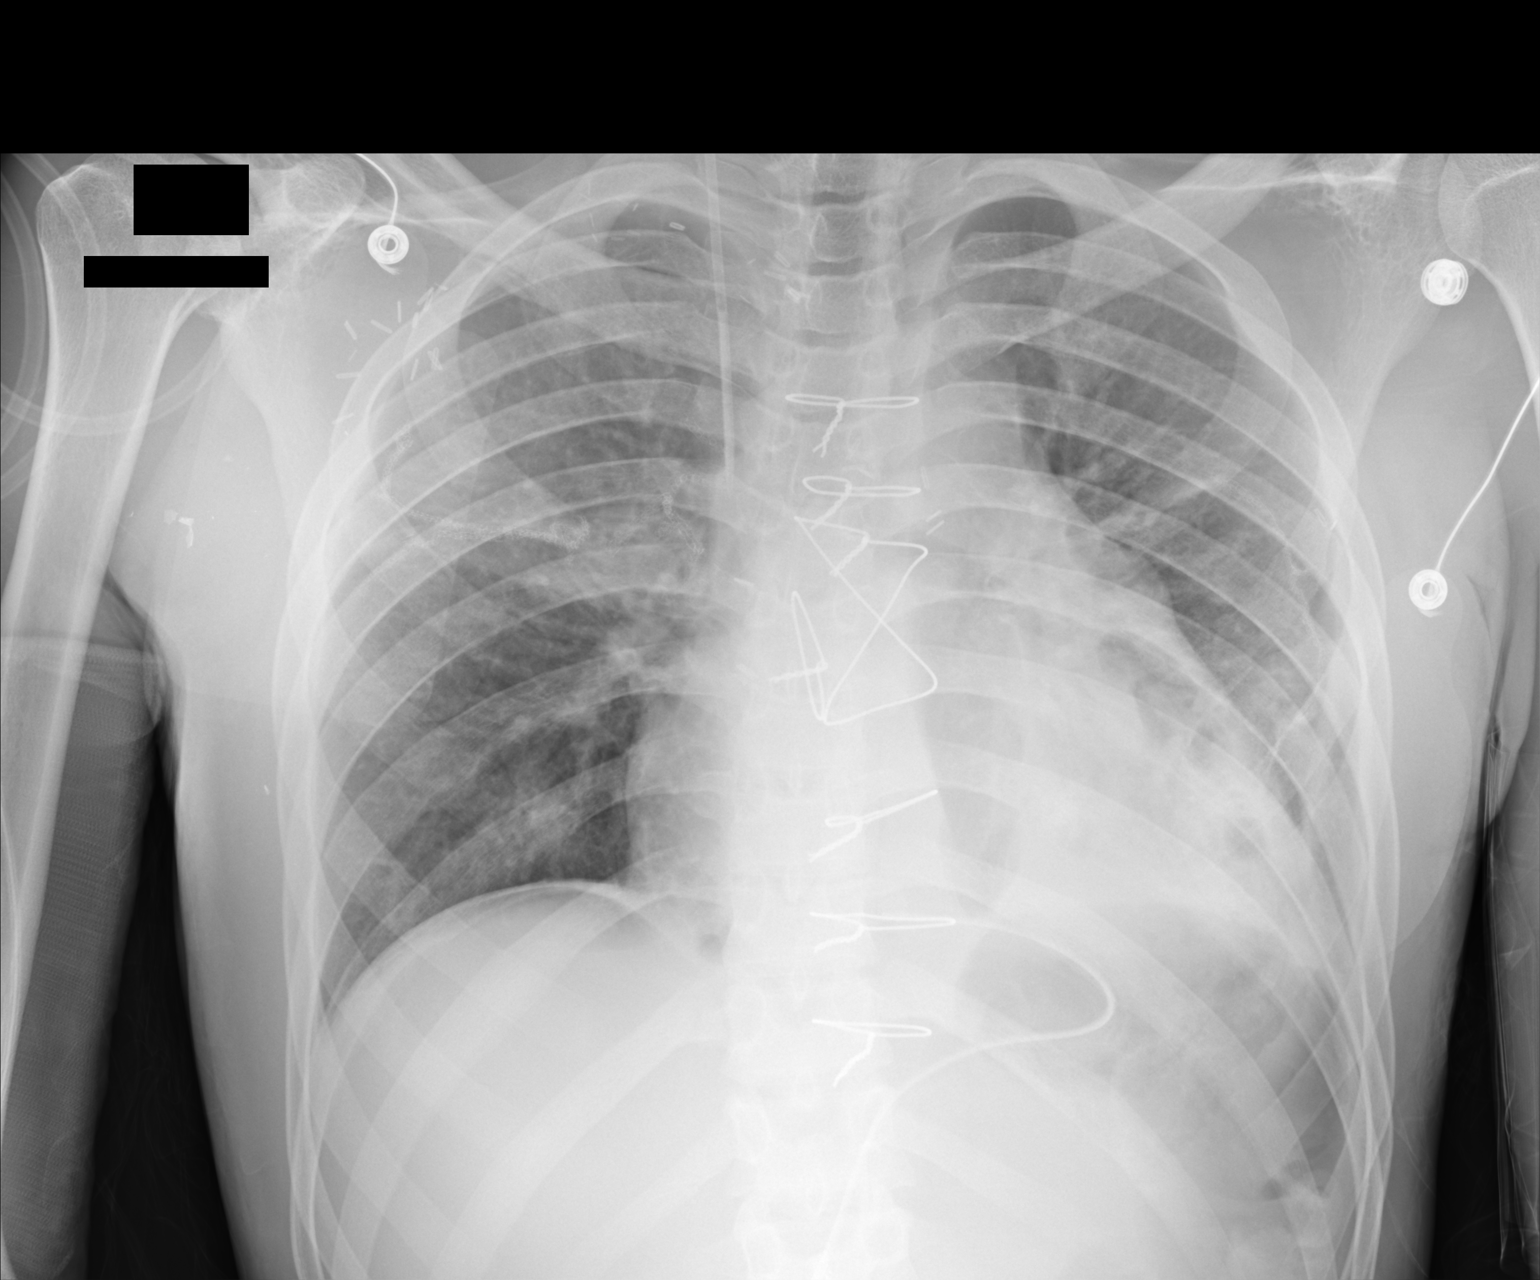

[1 of 1 positions shown; findings below may reference images not displayed]

FINDINGS: Interval placement of right neck vascular catheter, tip position
over the mid SVC. Status post median sternotomy. The cardiac
silhouette remains enlarged, with new air loculations projecting
over the left heart border. Pericardial drain is positioned about
the inferior heart border. Unchanged postoperative findings of
bilateral wedge resections and a loculated left pleural effusion.
There may be small, loculated pneumothorax components about the
lateral left lung, similar in appearance to prior radiographs and
CT. New atelectasis or consolidation at the left lung base.
IMPRESSION: 1. The cardiac silhouette remains enlarged, with new air loculations
projecting over the left heart border. Pericardial drain is
positioned about the inferior heart border. Findings may reflect
either pneumopericardium or loculated pneumothorax.
2. Unchanged postoperative findings of bilateral pulmonary wedge
resections and a loculated left pleural effusion. There may be
small, loculated pneumothorax components about the lateral left
lung, similar in appearance to prior radiographs and CT.
3. New atelectasis or consolidation at the left lung base.

## 2020-07-09 IMAGING — DX DG CHEST 1V PORT
1 series · 1 of 1 positions shown · non-contrast
Comparison: 07/05/2019

CLINICAL DATA: Status post pericardial window.

EXAM:
PORTABLE CHEST 1 VIEW

[chest]
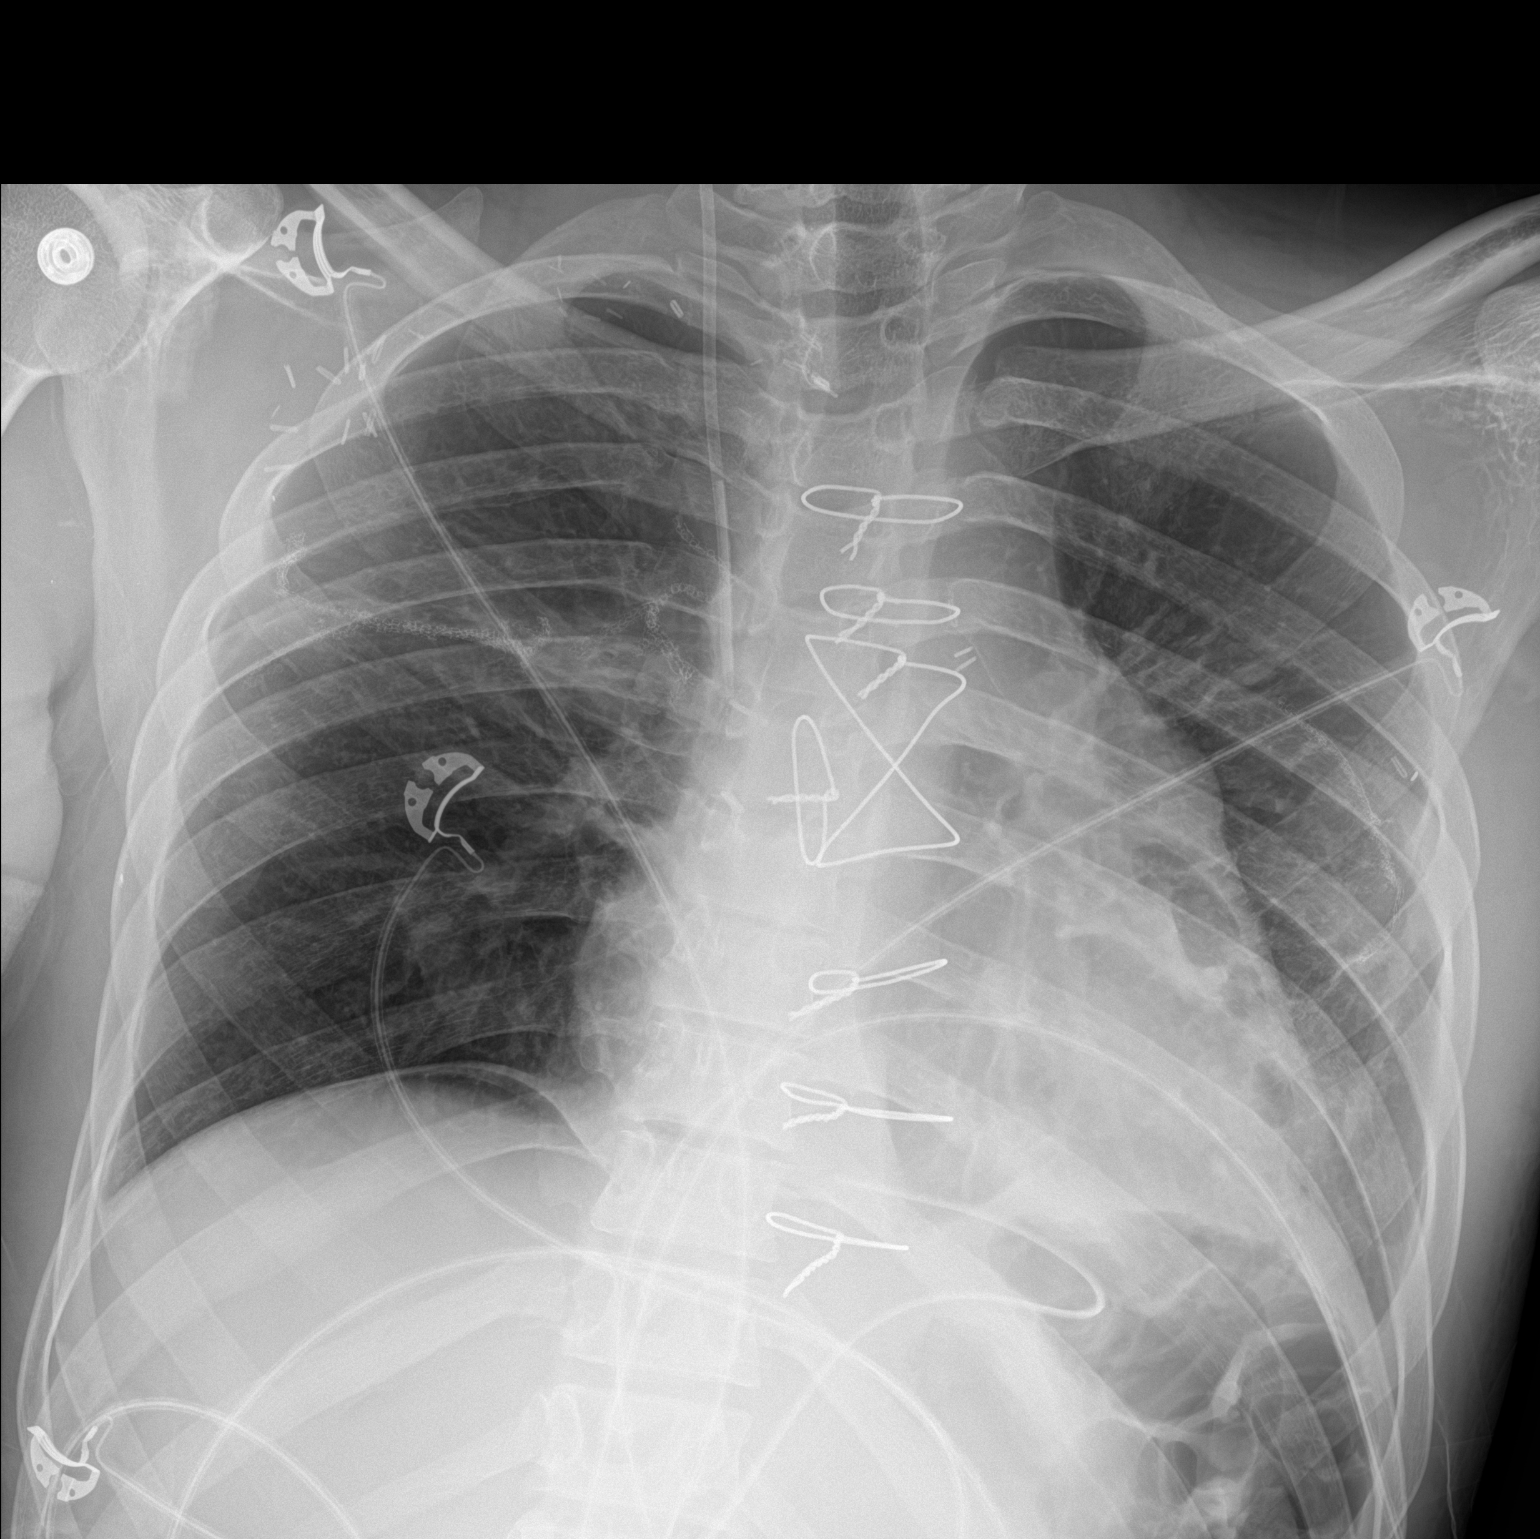

[1 of 1 positions shown; findings below may reference images not displayed]

FINDINGS: A right jugular catheter terminates over the SVC, unchanged.
Sequelae of median sternotomy are again identified with postsurgical
changes in both lungs. A pericardial drain remains in place. Lucency
under the right hemidiaphragm suggests small volume
pneumoperitoneum, unchanged and potentially related to recent
surgery. Loculated air collections projecting over the left heart
border are unchanged. There may be persistent small volume loculated
pneumothorax laterally on the left as previously seen as well. Left
lower lung airspace opacities have improved. The right lung is
grossly clear.
IMPRESSION: 1. Unchanged air projecting over the left heart border which may
reflect pneumopericardium or loculated pneumothorax.
2. Possible persistent small volume loculated left lateral
pneumothorax.
3. Improved left lower lung aeration.
4. Suspected unchanged small volume pneumoperitoneum.

## 2020-07-10 IMAGING — DX DG CHEST 1V PORT
1 series · 1 of 1 positions shown · non-contrast
Comparison: 07/06/2019

CLINICAL DATA: Pleural effusion.

EXAM:
PORTABLE CHEST 1 VIEW

[chest ap]
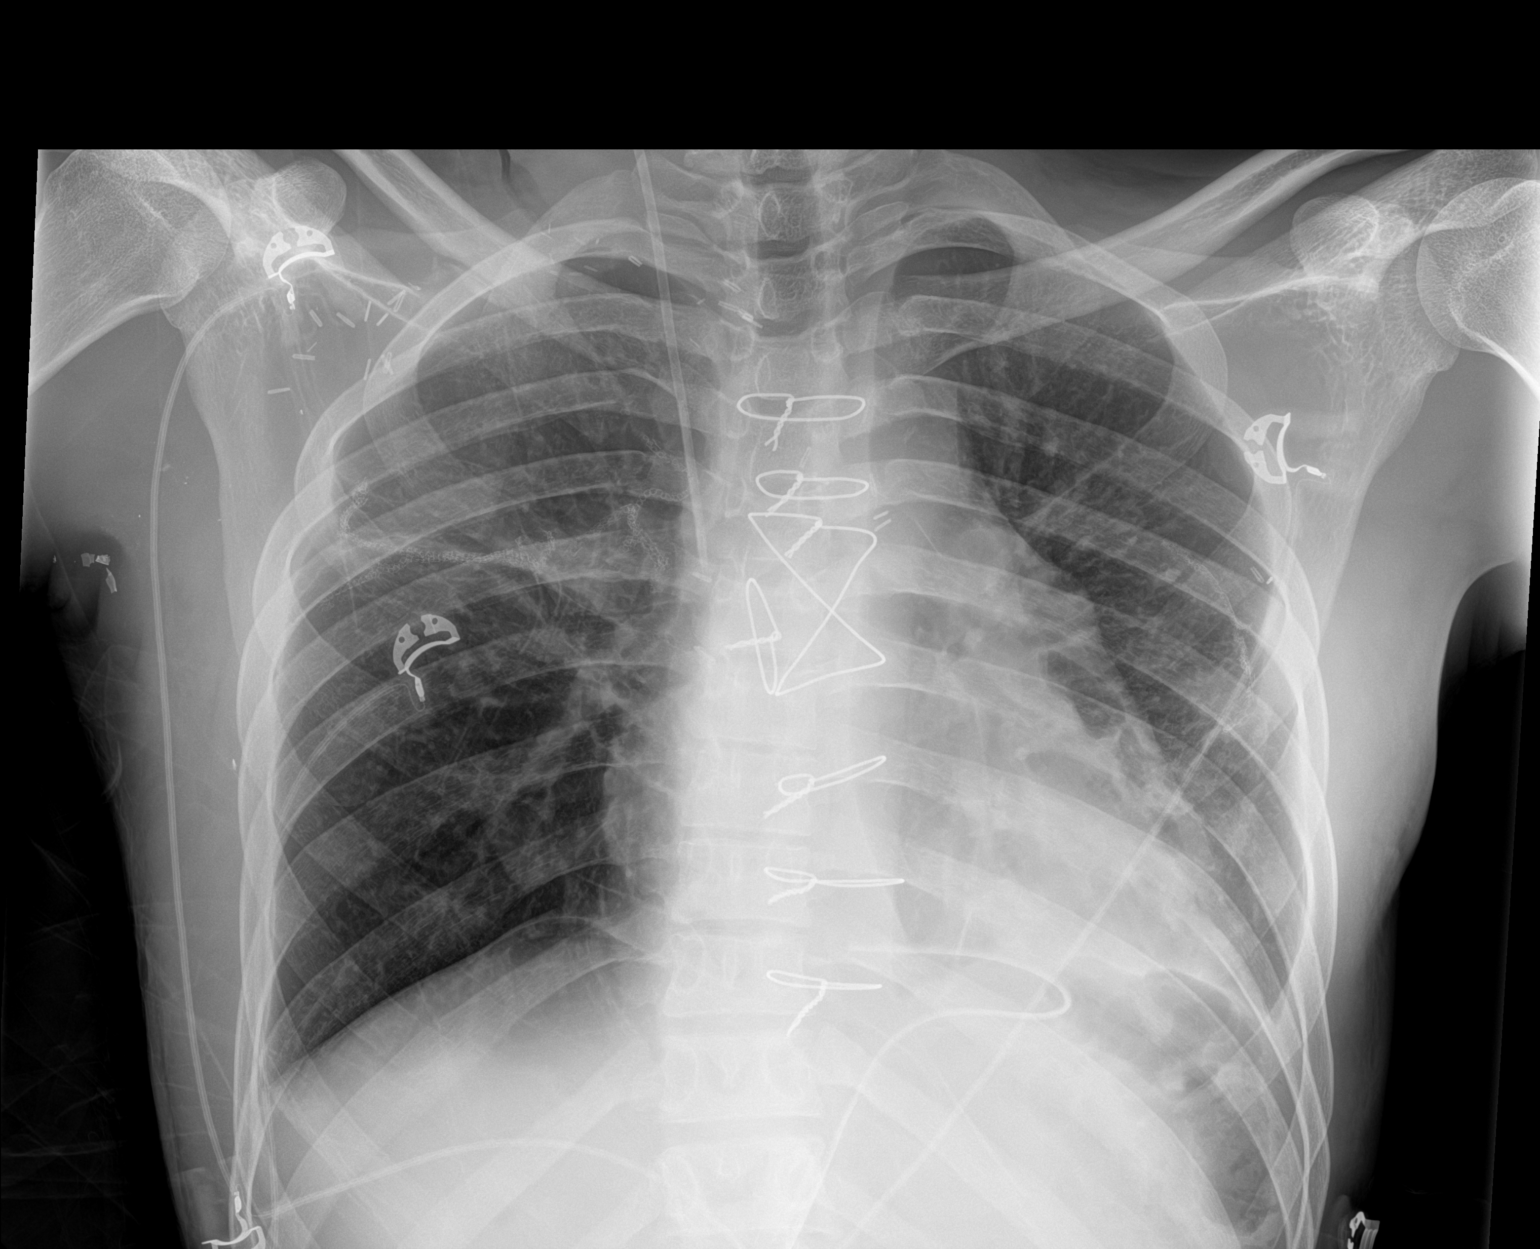

[1 of 1 positions shown; findings below may reference images not displayed]

FINDINGS: Postsurgical changes are again seen in the lungs bilaterally. A
right jugular catheter and pericardial drain are unchanged. There is
persistent mild lucency underneath the medial right hemidiaphragm
which may reflect small volume pneumoperitoneum. The
cardiomediastinal silhouette is unchanged with similar appearance of
small foci of gas projecting over the left heart border which may
reflect pneumopericardium or loculated pneumothorax. There may also
be a small residual loculated pneumothorax more laterally in the
left lung base as previously seen. Heterogeneous left basilar
airspace opacities are unchanged, and there may be a small
persistent left pleural effusion. The right lung remains grossly
clear.
IMPRESSION: Unchanged appearance of the chest as above.

## 2020-07-11 IMAGING — DX DG CHEST 1V PORT
1 series · 1 of 1 positions shown · non-contrast
Comparison: 07/07/2019

CLINICAL DATA: Pericardial effusion.

EXAM:
PORTABLE CHEST 1 VIEW

[chest]
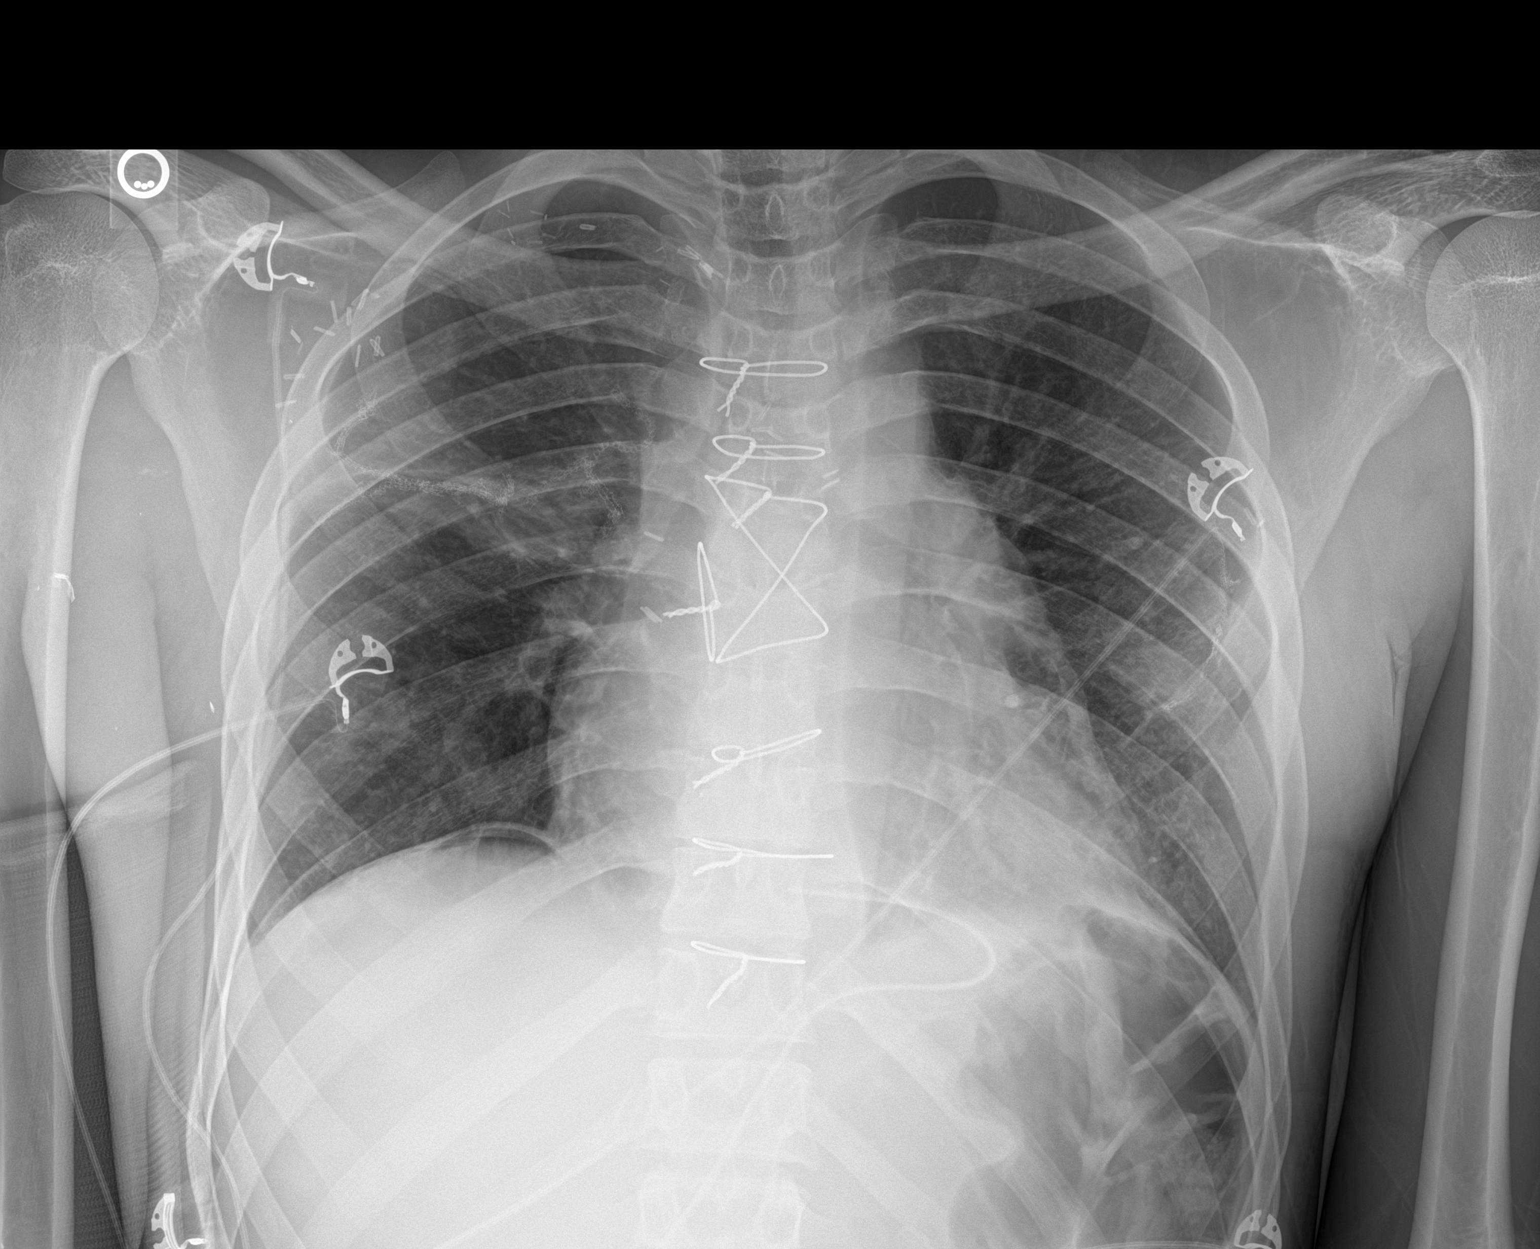

[1 of 1 positions shown; findings below may reference images not displayed]

FINDINGS: Right jugular central venous catheter has been removed since prior
exam. Mediastinal catheter remains in place likely within the
pericardial space. Mild pneumopericardium and small amount of free
intraperitoneal air along the right hemidiaphragm are unchanged, and
likely postoperative in etiology. Heart size is stable. Previous
median sternotomy. Decreased atelectasis is seen in the left
retrocardiac lung base. No pneumothorax visualized. Surgical staples
are seen in the right upper lobe.
IMPRESSION: 1. Stable heart size. Stable mild pneumopericardium and
pneumoperitoneum, likely postop in etiology.
2. Decreased left basilar atelectasis.
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
## 2020-07-28 ENCOUNTER — Encounter (HOSPITAL_COMMUNITY): Payer: Self-pay | Admitting: *Deleted

## 2020-07-28 ENCOUNTER — Other Ambulatory Visit: Payer: Self-pay

## 2020-07-28 ENCOUNTER — Emergency Department (HOSPITAL_COMMUNITY)
Admission: EM | Admit: 2020-07-28 | Discharge: 2020-07-29 | Disposition: A | Discharge status: Home / Self Care | Payer: 59 | Attending: Emergency Medicine | Admitting: Emergency Medicine

## 2020-07-28 DIAGNOSIS — J029 Acute pharyngitis, unspecified: Secondary | ICD-10-CM | POA: Insufficient documentation

## 2020-07-28 DIAGNOSIS — R059 Cough, unspecified: Secondary | ICD-10-CM | POA: Diagnosis present

## 2020-07-28 DIAGNOSIS — R519 Headache, unspecified: Secondary | ICD-10-CM | POA: Insufficient documentation

## 2020-07-28 DIAGNOSIS — Z5321 Procedure and treatment not carried out due to patient leaving prior to being seen by health care provider: Secondary | ICD-10-CM | POA: Diagnosis not present

## 2020-07-28 MED ORDER — ACETAMINOPHEN 325 MG PO TABS
650.0000 mg | ORAL_TABLET | Freq: Once | ORAL | Status: AC | PRN
  Administered 2020-07-28: 650 mg via ORAL
  Filled 2020-07-28: qty 2

## 2020-07-28 NOTE — ED Triage Notes (Signed)
Pt with cough and headache last night. Sore throat for 3 days, reports white patches on the left tonsils, says feel like his lymph nodes are swollen. Chills, did not know he had a fever.  Took home covid test negative.

## 2020-07-29 ENCOUNTER — Ambulatory Visit
Admission: EM | Admit: 2020-07-29 | Discharge: 2020-07-29 | Disposition: A | Discharge status: Home / Self Care | Payer: 59 | Attending: Emergency Medicine | Admitting: Emergency Medicine

## 2020-07-29 ENCOUNTER — Other Ambulatory Visit: Payer: Self-pay

## 2020-07-29 DIAGNOSIS — B2790 Infectious mononucleosis, unspecified without complication: Secondary | ICD-10-CM | POA: Diagnosis not present

## 2020-07-29 DIAGNOSIS — Z20822 Contact with and (suspected) exposure to covid-19: Secondary | ICD-10-CM

## 2020-07-29 LAB — GROUP A STREP BY PCR: Group A Strep by PCR: NOT DETECTED

## 2020-07-29 MED ORDER — DEXAMETHASONE SODIUM PHOSPHATE 10 MG/ML IJ SOLN
10.0000 mg | Freq: Once | INTRAMUSCULAR | Status: AC
  Administered 2020-07-29: 10 mg via INTRAMUSCULAR

## 2020-07-29 MED ORDER — IBUPROFEN 600 MG PO TABS
600.0000 mg | ORAL_TABLET | Freq: Four times a day (QID) | ORAL | 0 refills | Status: DC | PRN
Start: 2020-07-29 — End: 2020-11-25

## 2020-07-29 MED ORDER — IBUPROFEN 800 MG PO TABS
800.0000 mg | ORAL_TABLET | Freq: Once | ORAL | Status: AC
  Administered 2020-07-29: 800 mg via ORAL

## 2020-07-29 MED ORDER — ACETAMINOPHEN 325 MG PO TABS
975.0000 mg | ORAL_TABLET | Freq: Once | ORAL | Status: AC
  Administered 2020-07-29: 975 mg via ORAL

## 2020-07-29 NOTE — ED Triage Notes (Signed)
Patient states he has had a sore throat x 3 days and was in the ED last night but did not want to wait so he left. Pt states he has also had a mild headache x 3 days. Pt is aox4 and ambulatory.

## 2020-07-29 NOTE — Discharge Instructions (Addendum)
Your COVID will take several days to come back.  Your mono came back positive.  No contact sports for a month.y  1 gram of Tylenol and 600 mg ibuprofen together 3-4 times a day as needed for pain.  Make sure you drink plenty of extra fluids.  Some people find salt water gargles and  Traditional Medicinal's "Throat Coat" tea helpful. Take 5 mL of liquid Benadryl and 5 mL of Maalox. Mix it together, and then hold it in your mouth for as long as you can and then swallow. You may do this 4 times a day.    Below is a list of primary care practices who are taking new patients for you to follow-up with.  [facility] internal medicine clinic Ground Floor - [facility], [address] [place], [place], [place] [address] [phone]  [facility] at [facility] [address] [place], [place] [address] [phone]  [facility] [address] [place], [place] [address] [phone]  [facility] [phone] [address] [place] [address]  [facility]: [address] [place] [address]  [phone]  [facility]: [address] [place] [address]   [phone]  [facility]: [address] [place] [address]  [phone]  [place] [facility] : [address] [place] [address] [phone]  [facility]: [address] [place] [address] [phone]  [facility]: [address] [place] [address] [phone]  Dr. Oneal Grout [address] [facility] [place] [address]  [phone]  Dr. Jackie Plum, [facility]. [address] [place], [place] [address]  [phone] .    Go to www.goodrx.com to look up your  medications. This will give you a list of where you can find your prescriptions at the most affordable prices. Or ask the pharmacist what the cash price is, or if they have any other discount programs available to help make your medication more affordable. This can be less expensive than what you would pay with insurance.

## 2020-07-29 NOTE — ED Notes (Signed)
Pt stated he no longer wanted to wait and is leaving
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
## 2020-11-24 ENCOUNTER — Other Ambulatory Visit: Payer: Self-pay

## 2020-11-24 ENCOUNTER — Encounter (HOSPITAL_COMMUNITY): Payer: Self-pay | Admitting: Registered Nurse

## 2020-11-24 ENCOUNTER — Ambulatory Visit (HOSPITAL_COMMUNITY)
Admission: EM | Admit: 2020-11-24 | Discharge: 2020-11-26 | Disposition: A | Discharge status: Home / Self Care | Payer: 59 | Attending: Registered Nurse | Admitting: Registered Nurse

## 2020-11-24 DIAGNOSIS — R45851 Suicidal ideations: Secondary | ICD-10-CM | POA: Diagnosis not present

## 2020-11-24 DIAGNOSIS — F333 Major depressive disorder, recurrent, severe with psychotic symptoms: Secondary | ICD-10-CM | POA: Diagnosis present

## 2020-11-24 DIAGNOSIS — F1220 Cannabis dependence, uncomplicated: Secondary | ICD-10-CM | POA: Diagnosis not present

## 2020-11-24 DIAGNOSIS — R45850 Homicidal ideations: Secondary | ICD-10-CM | POA: Diagnosis not present

## 2020-11-24 DIAGNOSIS — Z9151 Personal history of suicidal behavior: Secondary | ICD-10-CM | POA: Diagnosis not present

## 2020-11-24 DIAGNOSIS — F1994 Other psychoactive substance use, unspecified with psychoactive substance-induced mood disorder: Secondary | ICD-10-CM | POA: Diagnosis not present

## 2020-11-24 DIAGNOSIS — Z20822 Contact with and (suspected) exposure to covid-19: Secondary | ICD-10-CM | POA: Diagnosis not present

## 2020-11-24 LAB — CBC WITH DIFFERENTIAL/PLATELET
Abs Immature Granulocytes: 0.01 10*3/uL (ref 0.00–0.07)
Basophils Absolute: 0 10*3/uL (ref 0.0–0.1)
Basophils Relative: 0 %
Eosinophils Absolute: 0 10*3/uL (ref 0.0–0.5)
Eosinophils Relative: 0 %
HCT: 43 % (ref 39.0–52.0)
Hemoglobin: 14.7 g/dL (ref 13.0–17.0)
Immature Granulocytes: 0 %
Lymphocytes Relative: 33 %
Lymphs Abs: 1.6 10*3/uL (ref 0.7–4.0)
MCH: 29.6 pg (ref 26.0–34.0)
MCHC: 34.2 g/dL (ref 30.0–36.0)
MCV: 86.5 fL (ref 80.0–100.0)
Monocytes Absolute: 0.3 10*3/uL (ref 0.1–1.0)
Monocytes Relative: 7 %
Neutro Abs: 3 10*3/uL (ref 1.7–7.7)
Neutrophils Relative %: 60 %
Platelets: 241 10*3/uL (ref 150–400)
RBC: 4.97 MIL/uL (ref 4.22–5.81)
RDW: 12.3 % (ref 11.5–15.5)
WBC: 5 10*3/uL (ref 4.0–10.5)
nRBC: 0 % (ref 0.0–0.2)

## 2020-11-24 LAB — COMPREHENSIVE METABOLIC PANEL
ALT: 30 U/L (ref 0–44)
AST: 24 U/L (ref 15–41)
Albumin: 4.5 g/dL (ref 3.5–5.0)
Alkaline Phosphatase: 71 U/L (ref 38–126)
Anion gap: 9 (ref 5–15)
BUN: 7 mg/dL (ref 6–20)
CO2: 25 mmol/L (ref 22–32)
Calcium: 9.2 mg/dL (ref 8.9–10.3)
Chloride: 105 mmol/L (ref 98–111)
Creatinine, Ser: 0.73 mg/dL (ref 0.61–1.24)
GFR, Estimated: >60 mL/min (ref >60)
Glucose, Bld: 70 mg/dL (ref 70–99)
Potassium: 4.1 mmol/L (ref 3.5–5.1)
Sodium: 139 mmol/L (ref 135–145)
Total Bilirubin: 0.9 mg/dL (ref 0.3–1.2)
Total Protein: 6.9 g/dL (ref 6.5–8.1)

## 2020-11-24 LAB — RESP PANEL BY RT-PCR (FLU A&B, COVID) ARPGX2
Influenza A by PCR: NEGATIVE
Influenza B by PCR: NEGATIVE
SARS Coronavirus 2 by RT PCR: NEGATIVE

## 2020-11-24 LAB — MAGNESIUM: Magnesium: 2.1 mg/dL (ref 1.7–2.4)

## 2020-11-24 LAB — ETHANOL: Alcohol, Ethyl (B): 88 mg/dL — ABNORMAL HIGH (ref <10)

## 2020-11-24 LAB — POC SARS CORONAVIRUS 2 AG -  ED: SARS Coronavirus 2 Ag: NEGATIVE

## 2020-11-24 MED ORDER — TRAZODONE HCL 50 MG PO TABS
50.0000 mg | ORAL_TABLET | Freq: Every evening | ORAL | Status: DC | PRN
  Administered 2020-11-24 – 2020-11-25 (×2): 50 mg via ORAL
  Filled 2020-11-24 (×2): qty 1

## 2020-11-24 MED ORDER — HYDROXYZINE HCL 25 MG PO TABS
25.0000 mg | ORAL_TABLET | Freq: Three times a day (TID) | ORAL | Status: DC | PRN
  Administered 2020-11-24: 25 mg via ORAL
  Filled 2020-11-24: qty 1

## 2020-11-24 MED ORDER — ALUM & MAG HYDROXIDE-SIMETH 200-200-20 MG/5ML PO SUSP: 30.0000 mL | ORAL | Status: DC | PRN

## 2020-11-24 MED ORDER — OLANZAPINE 5 MG PO TBDP
5.0000 mg | ORAL_TABLET | Freq: Every day | ORAL | Status: DC
  Administered 2020-11-24: 5 mg via ORAL
  Filled 2020-11-24: qty 1

## 2020-11-24 MED ORDER — ACETAMINOPHEN 325 MG PO TABS: 650.0000 mg | ORAL_TABLET | Freq: Four times a day (QID) | ORAL | Status: DC | PRN

## 2020-11-24 MED ORDER — MAGNESIUM HYDROXIDE 400 MG/5ML PO SUSP: 30.0000 mL | Freq: Every day | ORAL | Status: DC | PRN
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
## 2020-11-24 NOTE — ED Notes (Signed)
Patient sleeping.  RR even and unlabored.  Continue to monitor for safety. 

## 2020-11-24 NOTE — ED Notes (Signed)
Pt sleeping@this  time. Breathing even and unlabored. Will continent to monitor for safety

## 2020-11-24 NOTE — Discharge Instructions (Addendum)
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
## 2020-11-25 LAB — LIPID PANEL
Cholesterol: 205 mg/dL — ABNORMAL HIGH (ref 0–200)
HDL: 58 mg/dL (ref >40)
LDL Cholesterol: 127 mg/dL — ABNORMAL HIGH (ref 0–99)
Total CHOL/HDL Ratio: 3.5 RATIO
Triglycerides: 100 mg/dL (ref <150)
VLDL: 20 mg/dL (ref 0–40)

## 2020-11-25 LAB — TSH: TSH: 1.863 u[IU]/mL (ref 0.350–4.500)

## 2020-11-25 MED ORDER — NICOTINE 21 MG/24HR TD PT24
21.0000 mg | MEDICATED_PATCH | Freq: Every day | TRANSDERMAL | Status: DC
  Administered 2020-11-25 – 2020-11-26 (×2): 21 mg via TRANSDERMAL
  Filled 2020-11-25 (×2): qty 1

## 2020-11-25 MED ORDER — OLANZAPINE 5 MG PO TBDP
5.0000 mg | ORAL_TABLET | Freq: Two times a day (BID) | ORAL | Status: DC
  Administered 2020-11-25 – 2020-11-26 (×3): 5 mg via ORAL
  Filled 2020-11-25 (×3): qty 1

## 2020-11-25 NOTE — ED Notes (Signed)
Patient sleeping.  RR even and unlabored.  Continue to monitor for safety. 

## 2020-11-25 NOTE — ED Notes (Signed)
Pt asleep with even and unlabored respirations. No distress or discomfort noted. Pt remains safe on the unit. Will continue to monitor. 

## 2020-11-25 NOTE — Progress Notes (Signed)
Patient information has been sent to [facility] via secure chat to review for potential admission. Patient meets inpatient criteria per [name], NP.   Situation ongoing, CSW will continue to monitor progress.    Signed:  Damita Dunnings, MSW, LCSW-A  11/25/2020 9:54 AM

## 2020-11-25 NOTE — BH Assessment (Addendum)
Disposition:   Patient meets criteria for inpatient treatment per Liborio Nixon, NP.   Per [facility] AC Kim, RN, no bed availability at [facility] and patient to be faxed out to other hospitals.   Patient was faxed to the following facilities for consideration of placement.  [facility]     [facility]     [facility]     [facility]     [facility]     [facility]     [facility]     [facility]     [facility]     [facility]     [facility]     [facility]     [facility]     [facility]     [facility]     [facility]     [facility]
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
## 2020-11-25 NOTE — Progress Notes (Signed)
Pt is asleep. R/R are even and unlabored. No signs of acute distress noted. Staff will monitor for pt's safety. 

## 2020-11-25 NOTE — ED Notes (Addendum)
Pt asleep with even and unlabored respirations. No distress or discomfort noted. Pt remains safe on the unit. Will continue to monitor. 

## 2020-11-26 LAB — HEMOGLOBIN A1C
Hgb A1c MFr Bld: 5.5 % (ref 4.8–5.6)
Mean Plasma Glucose: 111 mg/dL

## 2020-11-26 MED ORDER — OLANZAPINE 5 MG PO TBDP: 5.0000 mg | ORAL_TABLET | Freq: Two times a day (BID) | ORAL | 0 refills | Status: DC

## 2020-11-26 MED ORDER — TRAZODONE HCL 50 MG PO TABS: 50.0000 mg | ORAL_TABLET | Freq: Every evening | ORAL | 0 refills | Status: AC | PRN

## 2020-11-26 MED ORDER — OLANZAPINE 5 MG PO TABS: 5.0000 mg | ORAL_TABLET | Freq: Two times a day (BID) | ORAL | 0 refills | Status: AC

## 2020-11-26 MED ORDER — HYDROXYZINE HCL 25 MG PO TABS: 25.0000 mg | ORAL_TABLET | Freq: Three times a day (TID) | ORAL | 0 refills | Status: AC | PRN

## 2020-11-26 NOTE — Progress Notes (Signed)
Patient has been declined by [facility], therefore he has been faxed out. Patient meets inpatient criteria per [facility] Rankin,NP. Patient referred to the following facilities: [facility]  [address], [place] [address] [phone]  [facility]  [address] [place] [address] [phone]  [facility] [address]  [address] [place] [address] [phone]  [facility]  [address] [place], [place] [address] [phone]  [facility]  [address] [place] [address] [phone]  [facility]  [phone] [place] [address] [phone]  [facility]  [address] [place], [place] [address] [phone]  [facility]  [address] [place] [address] [phone]  [facility]  [address] [place] [address] [phone]  [facility]  [address], [place] [address] [phone]  [facility]  [address] [place] [address] [phone]  [facility]  [address] [place] [address] [phone]  [facility]  [address] [place] [address] [phone]  [facility]  [address], [place] [address] [phone]  [facility]  [address] [place] [address] [phone]  [facility]  [address], [place] [address] [phone]  [facility]  [address], [place] [address] [phone]  [facility]  [address] [place] [address] [phone]  [facility]  [address], [place] [address] [phone]  [facility]  [address] [place] [address] [phone]   CSW will continue to monitor disposition.   Damita Dunnings, MSW, LCSW-A  7:49 AM 11/26/2020

## 2020-11-26 NOTE — ED Notes (Signed)
Pt asleep with even and unlabored respirations. No distress or discomfort noted. Pt remains safe on the unit. Will continue to monitor. 

## 2020-11-26 NOTE — ED Notes (Signed)
Remains asleep no c/o pain or discomfort

## 2020-11-26 NOTE — ED Notes (Signed)
Pt remains asleep resp 14 to 16 BPM chest raise and fall easy no distress noted
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
## 2020-11-26 NOTE — Progress Notes (Signed)
Pt alert and oriented on the unit. Education, support, and encouragement provided. Discharge summary, medications and follow up appointments reviewed with pt. Suicide prevention resources provided. Pt's belongings in locker returned and belongings sheet signed. Pt denies SI/HI, A/VH, pain, or any concerns at this time. Pt ambulatory on and off unit. Pt discharged to lobby. 

## 2020-11-26 NOTE — ED Notes (Signed)
Patient received AVS with follow up instructions with community resources.

## 2022-03-11 NOTE — Nursing Note (Signed)
Formatting of this note might be different from the original.                           [facility] - ED NURSING DISCHARGE NOTE                             AN AFFILIATE OF [facility]                          [place]  [place] [address]      NAME:[facility]    XF:[id number]    DOB:October 03, 1995    [id number]    ED Discharge Entered On:  03/11/2022 20:32 EDT   Performed On:  03/11/2022 20:32 EDT by Roddie Mc    ED Discharge with Pain Assessment  ED Dispo Mental Status :   Alert  ED Dispo Ambulatory Status :   Ambulatory  ED Dispo Discharge Materials :   Written Instructions Provided  Fluent in English :   Yes  ED Procedures :   No Major Procedures  Pain Management RTF :     Latest Pain Assessment:      The patient's most recent pain score was: 0 Performed at:   03/11/22 19:49 Performed by:  Otelia Sergeant      Last Pain Interventions: None Documented    Pain Present :   No actual or suspected pain  Critical Care Provided :   No  Roddie Mc - 03/11/2022 20:32 EDT  Electronically signed by Interface, Transcription In Interim at 03/11/2022  8:32 PM EDT
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
## 2022-04-26 ENCOUNTER — Emergency Department: Admit: 2022-04-26 | Payer: PRIVATE HEALTH INSURANCE

## 2022-04-26 ENCOUNTER — Inpatient Hospital Stay
Admit: 2022-04-26 | Discharge: 2022-04-26 | Disposition: A | Discharge status: Home / Self Care | Payer: PRIVATE HEALTH INSURANCE | Attending: Emergency Medicine

## 2022-04-26 DIAGNOSIS — R1030 Lower abdominal pain, unspecified: Secondary | ICD-10-CM

## 2022-04-26 DIAGNOSIS — R109 Unspecified abdominal pain: Secondary | ICD-10-CM

## 2022-04-26 LAB — CBC WITH AUTO DIFFERENTIAL
Absolute Immature Granulocyte: 0 10*3/uL (ref 0.00–0.04)
Basophils %: 0 % (ref 0–1)
Basophils Absolute: 0 10*3/uL (ref 0–1)
Eosinophils %: 1 % (ref 0–7)
Eosinophils Absolute: 0 10*3/uL (ref 0.0–0.4)
Hematocrit: 39.3 % (ref 36.6–50.3)
Hemoglobin: 13.7 g/dL (ref 12.1–17.0)
Immature Granulocytes: 0 % (ref 0–0.5)
Lymphocytes %: 36 % (ref 12–49)
Lymphocytes Absolute: 2.6 10*3/uL (ref 0.8–3.5)
MCH: 30 PG (ref 26.0–34.0)
MCHC: 34.9 g/dL (ref 30.0–36.5)
MCV: 86 FL (ref 80.0–99.0)
MPV: 9.4 FL (ref 8.9–12.9)
Monocytes %: 8 % (ref 5–13)
Monocytes Absolute: 0.6 10*3/uL (ref 0.0–1.0)
Neutrophils %: 55 % (ref 32–75)
Neutrophils Absolute: 4 10*3/uL (ref 1.8–8.0)
Nucleated RBCs: 0 PER 100 WBC
Platelets: 230 10*3/uL (ref 150–400)
RBC: 4.57 M/uL (ref 4.10–5.70)
RDW: 11.9 % (ref 11.5–14.5)
WBC: 7.3 10*3/uL (ref 4.1–11.1)
nRBC: 0 10*3/uL (ref 0.00–0.01)

## 2022-04-26 LAB — COMPREHENSIVE METABOLIC PANEL
ALT: 14 U/L (ref 10–50)
AST: 16 U/L (ref 10–50)
Albumin/Globulin Ratio: 2 (ref 1.1–2.2)
Albumin: 4.3 g/dL (ref 3.5–5.2)
Alk Phosphatase: 84 U/L (ref 40–129)
Anion Gap: 10 mmol/L (ref 5–15)
BUN: 8 MG/DL (ref 6–20)
Bun/Cre Ratio: 9 — ABNORMAL LOW (ref 12–20)
CO2: 28 mmol/L (ref 22–29)
Calcium: 9.2 MG/DL (ref 8.6–10.0)
Chloride: 107 mmol/L (ref 98–107)
Creatinine: 0.85 MG/DL (ref 0.70–1.20)
Est, Glom Filt Rate: >60 mL/min/{1.73_m2} (ref >60)
Globulin: 2.1 g/dL (ref 2.0–4.0)
Glucose: 96 mg/dL (ref 65–100)
Potassium: 3.6 mmol/L (ref 3.5–5.1)
Sodium: 145 mmol/L (ref 136–145)
Total Bilirubin: 0.2 MG/DL (ref 0.2–1.0)
Total Protein: 6.4 g/dL (ref 6.4–8.3)

## 2022-04-26 LAB — LIPASE: Lipase: 20 U/L (ref 13–60)

## 2022-04-26 MED ORDER — KETOROLAC TROMETHAMINE 30 MG/ML IJ SOLN
30 MG/ML | INTRAMUSCULAR | Status: AC
Start: 2022-04-26 — End: 2022-04-26
  Administered 2022-04-26: 08:00:00 15 mg via INTRAVENOUS

## 2022-04-26 MED ORDER — IOPAMIDOL 76 % IV SOLN
76 % | Freq: Once | INTRAVENOUS | Status: AC | PRN
Start: 2022-04-26 — End: 2022-04-26
  Administered 2022-04-26: 08:00:00 100 mL via INTRAVENOUS

## 2022-04-26 MED ORDER — SENNA-DOCUSATE SODIUM 8.6-50 MG PO TABS
ORAL_TABLET | Freq: Every day | ORAL | 0 refills | Status: AC
Start: 2022-04-26 — End: 2022-05-10

## 2022-04-26 MED ORDER — DICYCLOMINE HCL 10 MG PO CAPS
10 MG | ORAL_CAPSULE | Freq: Four times a day (QID) | ORAL | 0 refills | Status: AC
Start: 2022-04-26 — End: ?
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
## 2022-04-26 NOTE — ED Triage Notes (Signed)
Pt c/o of lower abd pain that started yesterday and has gotten worse. States that is "feels like there is a hole in my stomach". States that he had diarrhea but that it has stopped.     Pt appears to be uncomfortable.
